# Patient Record
Sex: Male | Born: 1985 | Race: Black or African American | Hispanic: No | Smoking: Current every day smoker
Health system: Southern US, Community
[De-identification: ages and names within clinical notes are randomized; demographics above are authoritative.]

## PROBLEM LIST (undated history)

## (undated) DIAGNOSIS — R569 Unspecified convulsions: Secondary | ICD-10-CM

---

## 2007-07-26 ENCOUNTER — Emergency Department (HOSPITAL_COMMUNITY): Admission: EM | Admit: 2007-07-26 | Discharge: 2007-07-26 | Payer: Self-pay | Admitting: Emergency Medicine

## 2011-03-27 ENCOUNTER — Emergency Department (HOSPITAL_COMMUNITY): Payer: Self-pay

## 2011-03-27 ENCOUNTER — Emergency Department (HOSPITAL_COMMUNITY)
Admission: EM | Admit: 2011-03-27 | Discharge: 2011-03-27 | Disposition: A | Discharge status: Home / Self Care | Payer: Self-pay | Attending: Emergency Medicine | Admitting: Emergency Medicine

## 2011-03-27 DIAGNOSIS — I10 Essential (primary) hypertension: Secondary | ICD-10-CM | POA: Insufficient documentation

## 2011-03-27 DIAGNOSIS — X500XXA Overexertion from strenuous movement or load, initial encounter: Secondary | ICD-10-CM | POA: Insufficient documentation

## 2011-03-27 DIAGNOSIS — G40909 Epilepsy, unspecified, not intractable, without status epilepticus: Secondary | ICD-10-CM | POA: Insufficient documentation

## 2011-03-27 DIAGNOSIS — S93409A Sprain of unspecified ligament of unspecified ankle, initial encounter: Secondary | ICD-10-CM | POA: Insufficient documentation

## 2011-04-15 ENCOUNTER — Emergency Department (HOSPITAL_COMMUNITY)
Admission: EM | Admit: 2011-04-15 | Discharge: 2011-04-15 | Disposition: A | Discharge status: Home / Self Care | Payer: Self-pay | Attending: Emergency Medicine | Admitting: Emergency Medicine

## 2011-04-15 ENCOUNTER — Emergency Department (HOSPITAL_COMMUNITY): Payer: Self-pay

## 2011-04-15 DIAGNOSIS — S93409A Sprain of unspecified ligament of unspecified ankle, initial encounter: Secondary | ICD-10-CM | POA: Insufficient documentation

## 2011-04-15 DIAGNOSIS — I10 Essential (primary) hypertension: Secondary | ICD-10-CM | POA: Insufficient documentation

## 2011-04-15 DIAGNOSIS — M25579 Pain in unspecified ankle and joints of unspecified foot: Secondary | ICD-10-CM | POA: Insufficient documentation

## 2011-04-15 DIAGNOSIS — R569 Unspecified convulsions: Secondary | ICD-10-CM | POA: Insufficient documentation

## 2011-04-15 DIAGNOSIS — X58XXXA Exposure to other specified factors, initial encounter: Secondary | ICD-10-CM | POA: Insufficient documentation

## 2012-07-01 IMAGING — CR DG ANKLE COMPLETE 3+V*R*
4 series · 4 of 4 positions shown · non-contrast
Comparison: None.

CLINICAL DATA: Right lateral ankle pain/swelling

RIGHT ANKLE - COMPLETE 3+ VIEW

[t ankle joint ap right]
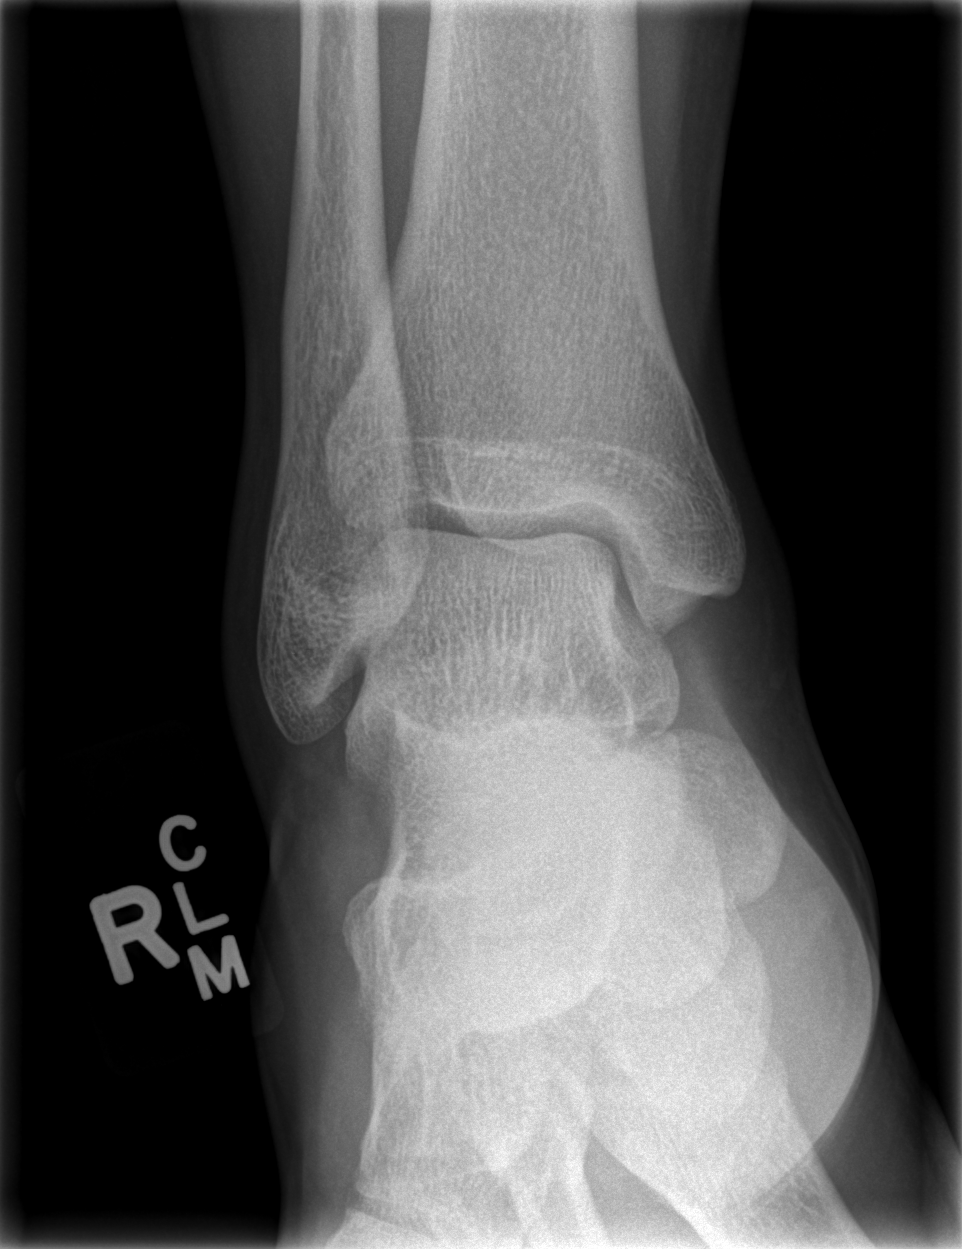

[t ankle joint oblique right]
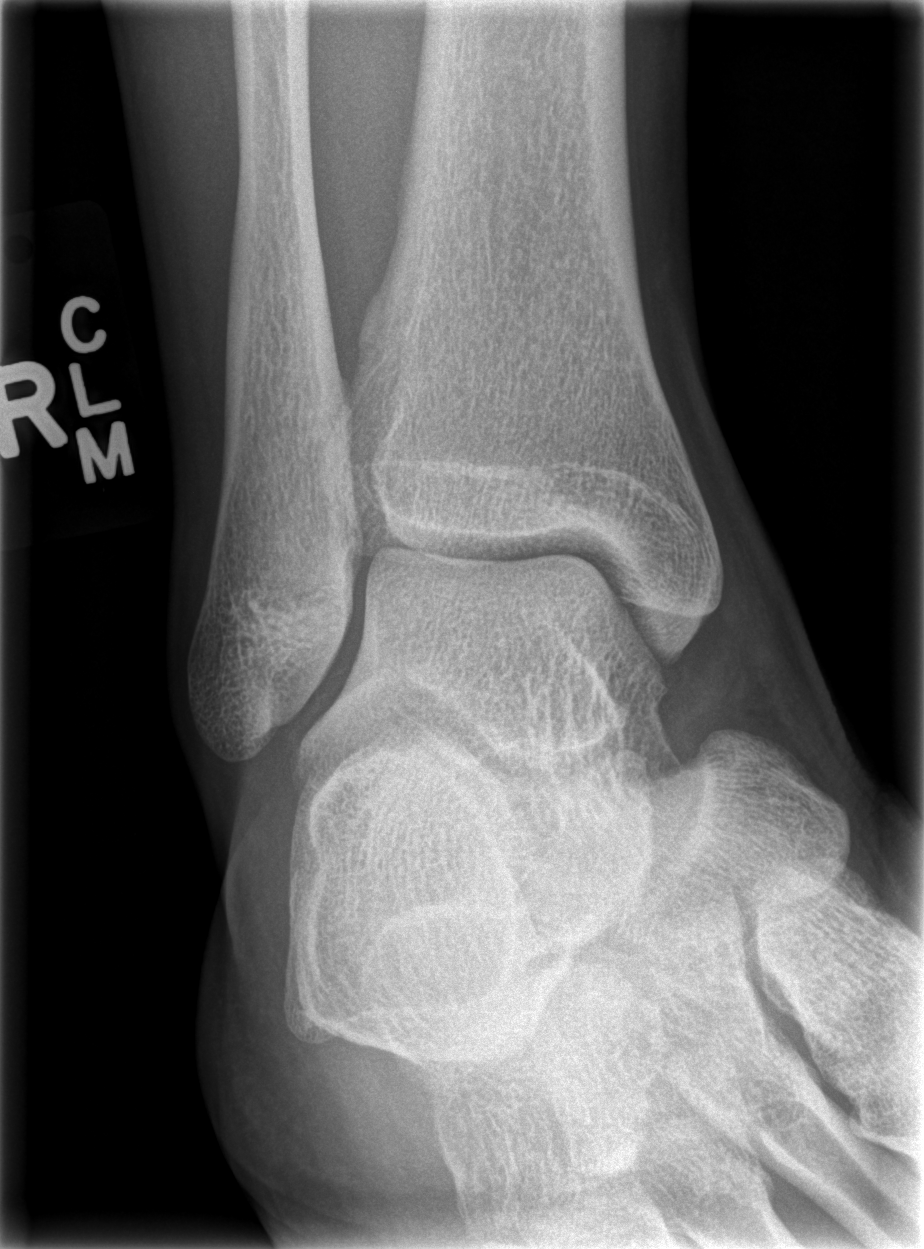

[t ankle joint lat right (1 of 2)]
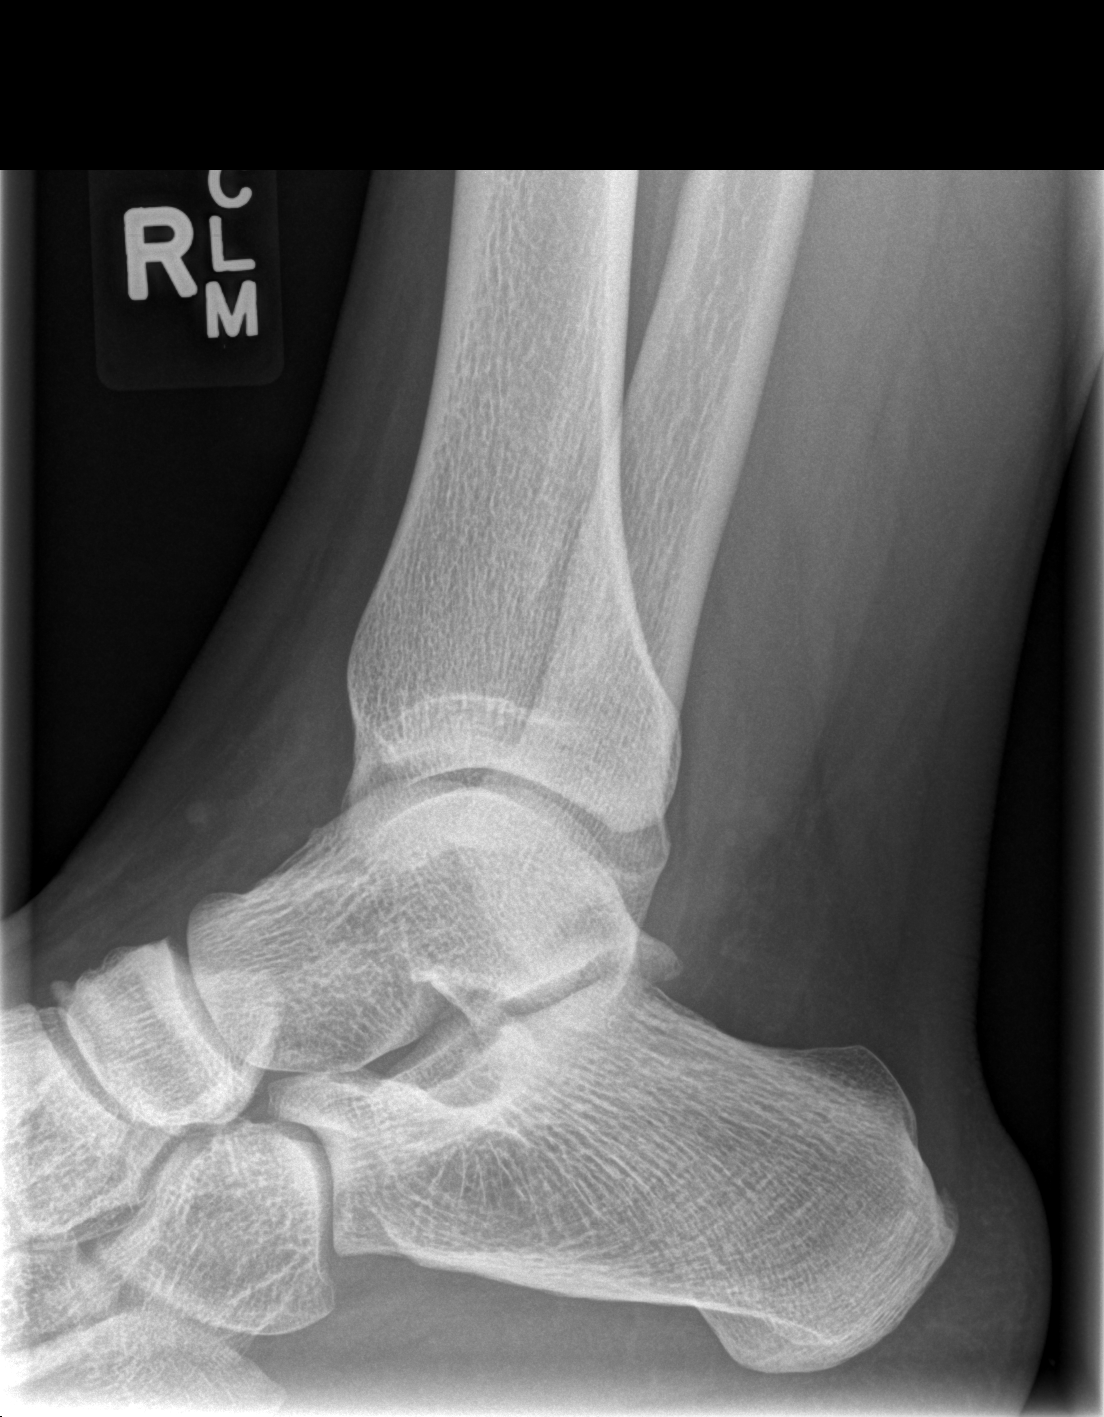

[t ankle joint lat right (2 of 2)]
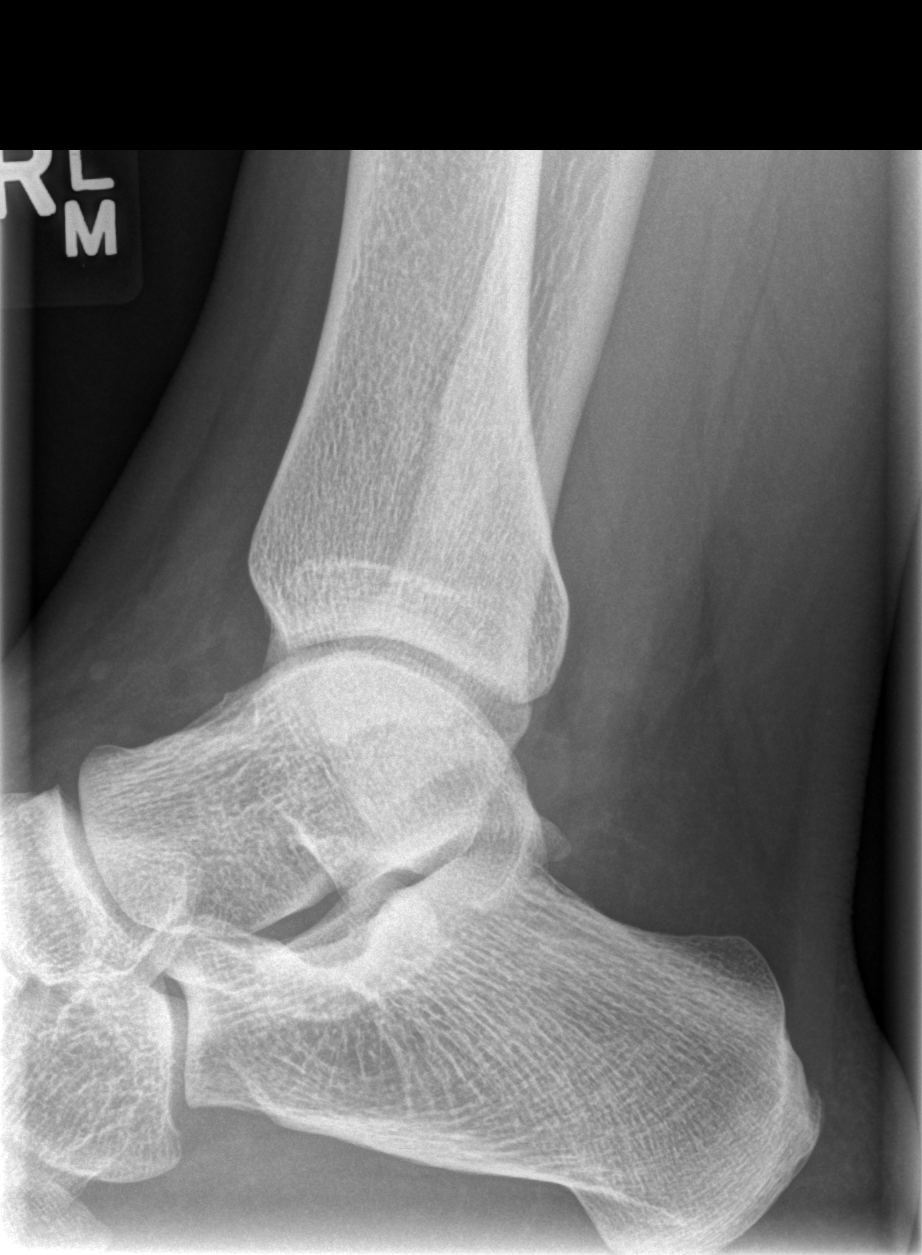

[4 of 4 positions shown; findings below may reference images not displayed]

FINDINGS: No fracture or dislocation is seen.  The ankle mortise is
intact.

The joint spaces are preserved.

Mild generalized soft tissue swelling about the ankle.
IMPRESSION: No fracture or dislocation is seen.

## 2016-11-09 ENCOUNTER — Encounter (HOSPITAL_COMMUNITY): Payer: Self-pay

## 2016-11-09 ENCOUNTER — Encounter (HOSPITAL_COMMUNITY): Payer: Self-pay | Admitting: Emergency Medicine

## 2016-11-09 ENCOUNTER — Emergency Department (HOSPITAL_COMMUNITY)
Admission: EM | Admit: 2016-11-09 | Discharge: 2016-11-09 | Disposition: A | Discharge status: Other Acute Inpatient Hospital | Payer: Managed Care, Other (non HMO) | Attending: Emergency Medicine | Admitting: Emergency Medicine

## 2016-11-09 ENCOUNTER — Inpatient Hospital Stay (HOSPITAL_COMMUNITY)
Admission: AD | Admit: 2016-11-09 | Discharge: 2016-11-11 | DRG: 885 | Disposition: A | Discharge status: Home / Self Care | Payer: 59 | Attending: Psychiatry | Admitting: Psychiatry

## 2016-11-09 DIAGNOSIS — Z5181 Encounter for therapeutic drug level monitoring: Secondary | ICD-10-CM | POA: Diagnosis not present

## 2016-11-09 DIAGNOSIS — F4321 Adjustment disorder with depressed mood: Secondary | ICD-10-CM | POA: Diagnosis present

## 2016-11-09 DIAGNOSIS — Z79899 Other long term (current) drug therapy: Secondary | ICD-10-CM | POA: Diagnosis not present

## 2016-11-09 DIAGNOSIS — R45851 Suicidal ideations: Secondary | ICD-10-CM | POA: Diagnosis present

## 2016-11-09 DIAGNOSIS — G4700 Insomnia, unspecified: Secondary | ICD-10-CM | POA: Diagnosis present

## 2016-11-09 DIAGNOSIS — F322 Major depressive disorder, single episode, severe without psychotic features: Secondary | ICD-10-CM | POA: Diagnosis present

## 2016-11-09 DIAGNOSIS — F17210 Nicotine dependence, cigarettes, uncomplicated: Secondary | ICD-10-CM

## 2016-11-09 DIAGNOSIS — F329 Major depressive disorder, single episode, unspecified: Secondary | ICD-10-CM | POA: Diagnosis present

## 2016-11-09 DIAGNOSIS — F4322 Adjustment disorder with anxiety: Secondary | ICD-10-CM | POA: Diagnosis present

## 2016-11-09 DIAGNOSIS — F17200 Nicotine dependence, unspecified, uncomplicated: Secondary | ICD-10-CM | POA: Insufficient documentation

## 2016-11-09 DIAGNOSIS — Z046 Encounter for general psychiatric examination, requested by authority: Secondary | ICD-10-CM | POA: Diagnosis present

## 2016-11-09 DIAGNOSIS — F1994 Other psychoactive substance use, unspecified with psychoactive substance-induced mood disorder: Secondary | ICD-10-CM | POA: Diagnosis not present

## 2016-11-09 HISTORY — DX: Unspecified convulsions: R56.9

## 2016-11-09 LAB — ETHANOL: Alcohol, Ethyl (B): <5 mg/dL (ref <5)

## 2016-11-09 LAB — COMPREHENSIVE METABOLIC PANEL
ALBUMIN: 5 g/dL (ref 3.5–5.0)
ALK PHOS: 57 U/L (ref 38–126)
ALT: 17 U/L (ref 17–63)
AST: 21 U/L (ref 15–41)
Anion gap: 9 (ref 5–15)
BUN: 11 mg/dL (ref 6–20)
CO2: 27 mmol/L (ref 22–32)
CREATININE: 1.32 mg/dL — AB (ref 0.61–1.24)
Calcium: 9.3 mg/dL (ref 8.9–10.3)
Chloride: 104 mmol/L (ref 101–111)
GFR calc Af Amer: >60 mL/min (ref >60)
GFR calc non Af Amer: >60 mL/min (ref >60)
GLUCOSE: 115 mg/dL — AB (ref 65–99)
Potassium: 3.9 mmol/L (ref 3.5–5.1)
SODIUM: 140 mmol/L (ref 135–145)
TOTAL PROTEIN: 8.8 g/dL — AB (ref 6.5–8.1)
Total Bilirubin: 0.9 mg/dL (ref 0.3–1.2)

## 2016-11-09 LAB — CBC WITH DIFFERENTIAL/PLATELET
BASOS PCT: 0 %
Basophils Absolute: 0 10*3/uL (ref 0.0–0.1)
Eosinophils Absolute: 0 10*3/uL (ref 0.0–0.7)
Eosinophils Relative: 0 %
HEMATOCRIT: 37.4 % — AB (ref 39.0–52.0)
Hemoglobin: 13.2 g/dL (ref 13.0–17.0)
LYMPHS PCT: 20 %
Lymphs Abs: 1.7 10*3/uL (ref 0.7–4.0)
MCH: 31 pg (ref 26.0–34.0)
MCHC: 35.3 g/dL (ref 30.0–36.0)
MCV: 87.8 fL (ref 78.0–100.0)
Monocytes Absolute: 0.5 10*3/uL (ref 0.1–1.0)
Monocytes Relative: 6 %
NEUTROS ABS: 6.1 10*3/uL (ref 1.7–7.7)
Neutrophils Relative %: 74 %
PLATELETS: 439 10*3/uL — AB (ref 150–400)
RBC: 4.26 MIL/uL (ref 4.22–5.81)
RDW: 11.7 % (ref 11.5–15.5)
WBC: 8.3 10*3/uL (ref 4.0–10.5)

## 2016-11-09 LAB — RAPID URINE DRUG SCREEN, HOSP PERFORMED
AMPHETAMINES: NOT DETECTED
BENZODIAZEPINES: NOT DETECTED
Barbiturates: NOT DETECTED
COCAINE: NOT DETECTED
Opiates: NOT DETECTED
Tetrahydrocannabinol: POSITIVE — AB

## 2016-11-09 MED ORDER — ONDANSETRON HCL 4 MG PO TABS: 4.0000 mg | ORAL_TABLET | Freq: Three times a day (TID) | ORAL | Status: DC | PRN

## 2016-11-09 MED ORDER — ALUM & MAG HYDROXIDE-SIMETH 200-200-20 MG/5ML PO SUSP: 30.0000 mL | ORAL | Status: DC | PRN

## 2016-11-09 MED ORDER — IBUPROFEN 600 MG PO TABS: 600.0000 mg | ORAL_TABLET | Freq: Three times a day (TID) | ORAL | Status: DC | PRN

## 2016-11-09 MED ORDER — FLUOXETINE HCL 10 MG PO CAPS
10.0000 mg | ORAL_CAPSULE | Freq: Every day | ORAL | Status: DC
  Filled 2016-11-09: qty 1

## 2016-11-09 MED ORDER — ACETAMINOPHEN 325 MG PO TABS: 650.0000 mg | ORAL_TABLET | ORAL | Status: DC | PRN

## 2016-11-09 MED ORDER — LORAZEPAM 1 MG PO TABS: 0.0000 mg | ORAL_TABLET | Freq: Two times a day (BID) | ORAL | Status: DC

## 2016-11-09 MED ORDER — THIAMINE HCL 100 MG/ML IJ SOLN: 100.0000 mg | Freq: Every day | INTRAMUSCULAR | Status: DC

## 2016-11-09 MED ORDER — TRAZODONE HCL 50 MG PO TABS
50.0000 mg | ORAL_TABLET | Freq: Every day | ORAL | Status: DC
  Filled 2016-11-09 (×4): qty 1

## 2016-11-09 MED ORDER — NICOTINE 21 MG/24HR TD PT24
21.0000 mg | MEDICATED_PATCH | Freq: Every day | TRANSDERMAL | Status: DC
  Filled 2016-11-09 (×5): qty 1

## 2016-11-09 MED ORDER — ACETAMINOPHEN 325 MG PO TABS
650.0000 mg | ORAL_TABLET | ORAL | Status: DC | PRN
Start: 2016-11-09 — End: 2016-11-11

## 2016-11-09 MED ORDER — LORAZEPAM 1 MG PO TABS: 0.0000 mg | ORAL_TABLET | Freq: Four times a day (QID) | ORAL | Status: DC

## 2016-11-09 MED ORDER — VITAMIN B-1 100 MG PO TABS
100.0000 mg | ORAL_TABLET | Freq: Every day | ORAL | Status: DC
  Administered 2016-11-09: 100 mg via ORAL
  Filled 2016-11-09: qty 1

## 2016-11-09 MED ORDER — ZOLPIDEM TARTRATE 5 MG PO TABS
5.0000 mg | ORAL_TABLET | Freq: Every evening | ORAL | Status: DC | PRN
Start: 2016-11-09 — End: 2016-11-09

## 2016-11-09 MED ORDER — TRAZODONE HCL 50 MG PO TABS: 50.0000 mg | ORAL_TABLET | Freq: Every day | ORAL | Status: DC

## 2016-11-09 MED ORDER — IBUPROFEN 200 MG PO TABS: 600.0000 mg | ORAL_TABLET | Freq: Three times a day (TID) | ORAL | Status: DC | PRN

## 2016-11-09 MED ORDER — FLUOXETINE HCL 10 MG PO CAPS
10.0000 mg | ORAL_CAPSULE | Freq: Every day | ORAL | Status: DC
  Filled 2016-11-09 (×5): qty 1

## 2016-11-09 MED ORDER — MAGNESIUM HYDROXIDE 400 MG/5ML PO SUSP: 30.0000 mL | Freq: Every day | ORAL | Status: DC | PRN

## 2016-11-09 MED ORDER — NICOTINE 21 MG/24HR TD PT24: 21.0000 mg | MEDICATED_PATCH | Freq: Every day | TRANSDERMAL | Status: DC

## 2016-11-09 NOTE — ED Notes (Signed)
Patient offered breakfast but is not eating.

## 2016-11-09 NOTE — ED Notes (Signed)
Bed: WA32 Expected date:  Expected time:  Means of arrival:  Comments: 
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
## 2016-11-09 NOTE — ED Notes (Signed)
Pt was with his girlfriend and left her a voicemail that he was going to "blow his brains out." there is a gun in the house. When [employer] arrived, they found him trying to back out of the driveway in a car with a loaded gun. He denies SI to police.

## 2016-11-09 NOTE — ED Notes (Signed)
[facility], Evangeline GulaKaren RN.  Patient to be transported one hour  after another patient who is [facility]'d [facility]  [employer] called for transport.

## 2016-11-09 NOTE — BH Assessment (Addendum)
Assessment Note  Kyle Sullivan is an 31 y.o. male prsenting to [facility]ED under IVC by the EDP.    IVC states: left a message on his girlfriend's voicemail that he was going to "blow his brains out." Police found him in his car with a loaded gun.  Patient states that he "felt like the weight of the world was on my shoulders" due to losing his job and states that he usually does not drink but drank about one fourth of a fifth of Vodka. Patient states that after drinking he called his girlfriend and left her a message. Patient states "that's njust how we do, like when we're stressed. She thought I was going to leave her and she told me she was going to kill herself and I went to check on her then." Patient states that after leaving the message he got a call from [employer] and was offered a job to start tomorrow. Patient states "I should have called her back but I didn't." Patient states that he felt better after getting the job offer and went to buy some cigarettes when he realized that his girlfriends gun was in the car. Patient states "I don't have a concealed to carry so I put it on the seat to go to the store." Patient states "she usually has it in her car or in the safe - that I don't even have access to, but she left in my car." patient states "this is the worst time for her to have left it in my car."   Patient denies suicidal ideations and history of attempts. Patient denies self injurious behaviors. Patient denies history of depression. Patient denies feeling depressed at this time. Patient denies symptoms of depression when asked. Patient denies homicidal ideations and history of aggression. Patient states that his girlfriend does have a gun but states she usually keeps it in a safe that he does not have access to. Patient denies pending charges or upcoming court dates. Patient denies active probation. Patient denies auditory and visual hallucinations. Patient does not appear to be responding  to internal stimuli. Patient denies use of drugs and states that he rarely drinks alcohol. Patient UDS +THC and BAL <5. Patient denies previous inpatient and outpatient psychiatric history. Patient denies history of trauma/abuse. Patient states that his mother, father, and brothers are supportive.   Consulted with Kyle SievertSpencer Simon, PA-C who recommends AM Psych eval.   Diagnosis: Adjustment Disorder  Past Medical History:  Past Medical History:  Diagnosis Date  . Seizure [facility](HCC)     History reviewed. No pertinent surgical history.  Family History: History reviewed. No pertinent family history.  Social History:  reports that he has been smoking.  He has never used smokeless tobacco. He reports that he drinks alcohol. He reports that he does not use drugs.  Additional Social History:  Alcohol / Drug Use Pain Medications: Denies Prescriptions: Denies Over the Counter: Denies History of alcohol / drug use?: No history of alcohol / drug abuse  CIWA: CIWA-Ar BP: 151/100 Pulse Rate: 87 COWS:    Allergies: No Known Allergies  Home Medications:  (Not in a hospital admission)  OB/GYN Status:  No LMP for male patient.  General Assessment Data Location of Assessment: [facility] TTS Assessment: In system Is this a Tele or Face-to-Face Assessment?: Face-to-Face Is this an Initial Assessment or a Re-assessment for this encounter?: Initial Assessment Marital status: Divorced (2 years ) Is patient pregnant?: No Pregnancy Status: No Living Arrangements: Spouse/significant other (  girlfriend) Can pt return to current living arrangement?: Yes Admission Status: Involuntary Is patient capable of signing voluntary admission?: No Referral Source: Self/Family/Friend     Crisis Care Plan Living Arrangements: Spouse/significant other (girlfriend) Name of Psychiatrist: None Name of Therapist: None  Education Status Is patient currently in school?: No Highest grade of school patient has completed:  culinary degree  Risk to self with the past 6 months Suicidal Ideation: No Has patient been a risk to self within the past 6 months prior to admission? : No Suicidal Intent: No Has patient had any suicidal intent within the past 6 months prior to admission? : No Is patient at risk for suicide?: No Suicidal Plan?: No Has patient had any suicidal plan within the past 6 months prior to admission? : No Access to Means: No What has been your use of drugs/alcohol within the last 12 months?: Denies Previous Attempts/Gestures: No How many times?: 0 Other Self Harm Risks: Denies Triggers for Past Attempts: None known Intentional Self Injurious Behavior: None Family Suicide History: No Recent stressful life event(s): Job Loss Persecutory voices/beliefs?: No Depression: No (patient denies) Depression Symptoms:  (denies symptoms) Substance abuse history and/or treatment for substance abuse?: No Suicide prevention information given to non-admitted patients: Not applicable  Risk to Others within the past 6 months Homicidal Ideation: No Does patient have any lifetime risk of violence toward others beyond the six months prior to admission? : No Thoughts of Harm to Others: No Current Homicidal Intent: No Current Homicidal Plan: No Access to Homicidal Means: No Identified Victim: Denies History of harm to others?: No Assessment of Violence: None Noted Violent Behavior Description: Denies Does patient have access to weapons?: Yes (Comment) (patients girlfriend has a gun ) Criminal Charges Pending?: No Does patient have a court date: No Is patient on probation?: No  Psychosis Hallucinations: None noted Delusions: None noted  Mental Status Report Appearance/Hygiene: In scrubs Eye Contact: Good Motor Activity: Unable to assess Speech: Logical/coherent Level of Consciousness: Alert Mood: Pleasant Affect: Appropriate to circumstance Anxiety Level: None Thought Processes: Coherent,  Relevant Judgement: Unimpaired Orientation: Person, Place, Time, Situation, Appropriate for developmental age Obsessive Compulsive Thoughts/Behaviors: None  Cognitive Functioning Concentration: Normal Memory: Recent Intact, Remote Intact IQ: Average Insight: Fair Impulse Control: Fair Appetite: Good Sleep: No Change Total Hours of Sleep:  (6-8) Vegetative Symptoms: None  ADLScreening [facility] Assessment Services) Patient's cognitive ability adequate to safely complete daily activities?: Yes Patient able to express need for assistance with ADLs?: Yes Independently performs ADLs?: Yes (appropriate for developmental age)  Prior Inpatient Therapy Prior Inpatient Therapy: No Prior Therapy Dates: N/A Prior Therapy Facilty/Provider(s): N/A Reason for Treatment: N/A  Prior Outpatient Therapy Prior Outpatient Therapy: No Prior Therapy Dates: N/A Prior Therapy Facilty/Provider(s): N/A Reason for Treatment: N/A Does patient have an ACCT team?: No Does patient have Intensive In-House Services?  : No Does patient have [facility] services? : No Does patient have P4CC services?: No  ADL Screening (condition at time of admission) Patient's cognitive ability adequate to safely complete daily activities?: Yes Is the patient deaf or have difficulty hearing?: No Does the patient have difficulty seeing, even when wearing glasses/contacts?: No Does the patient have difficulty concentrating, remembering, or making decisions?: No Patient able to express need for assistance with ADLs?: Yes Does the patient have difficulty dressing or bathing?: No Independently performs ADLs?: Yes (appropriate for developmental age) Does the patient have difficulty walking or climbing stairs?: No Weakness of Legs: None Weakness of Arms/Hands: None  Home Assistive Devices/Equipment Home Assistive Devices/Equipment: None    Abuse/Neglect Assessment (Assessment to be complete while patient is alone) Physical Abuse:  Denies Verbal Abuse: Denies Sexual Abuse: Denies Exploitation of patient/patient's resources: Denies Self-Neglect: Denies Values / Beliefs Cultural Requests During Hospitalization: None Spiritual Requests During Hospitalization: None Consults Spiritual Care Consult Needed: No Social Work Consult Needed: No Merchant navy officer (For Healthcare) Does Patient Have a Medical Advance Directive?: No Would patient like information on creating a medical advance directive?: No - Patient declined    Additional Information 1:1 In Past 12 Months?: No CIRT Risk: No Elopement Risk: No Does patient have medical clearance?: Yes     Disposition:  Disposition Initial Assessment Completed for this Encounter: Yes Disposition of Patient: Other dispositions (Am psych eval per Kyle Sievert, PA-C) Other disposition(s): Other (Comment)  On Site Evaluation by:   Reviewed with Physician:    Nicky Milhouse 11/09/2016 4:26 AM
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
## 2016-11-09 NOTE — Progress Notes (Signed)
Pt rated his day a 10 out of 10. Pt goal for tomorrow is to talk with the doctor about discharge plans.

## 2016-11-09 NOTE — BH Assessment (Signed)
Assessment completed.  Consulted with Donell SievertSpencer Simon, PA-C who recommends AM psych eval.   Davina PokeJoVea Jourdan Durbin, LCSW Therapeutic Triage Specialist [facility] 11/09/2016 3:03 AM

## 2016-11-09 NOTE — Tx Team (Addendum)
Initial Treatment Plan 11/09/2016 5:38 PM Kyle ArtistDarwin D Bi ZOX:[id number]    PATIENT STRESSORS: Financial difficulties Other: Relationship conflict   PATIENT STRENGTHS: Average or above average intelligence Capable of independent living Communication skills Supportive family/friends   PATIENT IDENTIFIED PROBLEMS: "Alvy BealSteer clear of that woman"   "Continue on with my life in a peaceful manner"                   DISCHARGE CRITERIA:  Adequate post-discharge living arrangements Improved stabilization in mood, thinking, and/or behavior  PRELIMINARY DISCHARGE PLAN: Outpatient therapy  PATIENT/FAMILY INVOLVEMENT: This treatment plan has been presented to and reviewed with the patient, Kyle Sullivan.  The patient and family have been given the opportunity to ask questions and make suggestions.  Maurine SimmeringShugart, Fate Galanti M, RN 11/09/2016, 5:38 PM

## 2016-11-09 NOTE — BH Assessment (Signed)
[facility] Assessment Progress Note  Per Thedore MinsMojeed Akintayo, MD, this pt requires psychiatric hospitalization.  Malva LimesLinsey Strader, RN, [facility].  Pt presents under IVC initiated by EDP Ronna Poliohristopher Polina, MD, however, paperwork is incomplete and therefore invalid.  A new IVC has been initiated by Dr Jannifer FranklinAkintayo, and [place] [facility].  At 11:32 Burna CashMagistrate Morton confirms receipt.  As of this writing, service of Findings and Custody Order is pending.  Petition and First Examination [facility].  Pt's nurse, Aram BeechamCynthia, has been notified, and agrees to call report to [phone].  Pt is to be transported via Patent examinerlaw enforcement.   Doylene Canninghomas Blaze Sandin, MA Triage Specialist [phone]

## 2016-11-09 NOTE — Consult Note (Signed)
[facility] Face-to-Face Psychiatry Consult   Reason for Consult:  Depression with suicide attempt Referring Physician:  EDP Patient Identification: Kyle Sullivan MRN:  [id number] Principal Diagnosis: Major depressive disorder, single episode, severe without psychosis ([place]) Diagnosis:   Patient Active Problem List   Diagnosis Date Noted  . Major depressive disorder, single episode, severe without psychosis ([place]) [F32.2] 11/09/2016    Priority: High    Total Time spent with patient: 45 minutes  Subjective:   Kyle Sullivan is a 31 y.o. male patient admitted with suicide attempt.  HPI:  31 yo male who presented to the ED with depression and suicide attempt by a gun.  He sent his girlfriend a  saying he was going to blow his brain's out.  She called the police and they found him in the car with a loaded gun.  Today, he denies having the gun, stating it is his girlfriends.  He reports he does not know what he said to his girlfriend.  Recently lost his job but he states he just got another one last night.  No prior history, no hallucinations or homicidal ideations.  Reports he socially drinks.  Past Psychiatric History: none   Risk to Self: Yes Risk to Others: Homicidal Ideation: No Thoughts of Harm to Others: No Current Homicidal Intent: No Current Homicidal Plan: No Access to Homicidal Means: No Identified Victim: Denies History of harm to others?: No Assessment of Violence: None Noted Violent Behavior Description: Denies Does patient have access to weapons?: Yes (Comment) (patients girlfriend has a gun ) Criminal Charges Pending?: No Does patient have a court date: No Prior Inpatient Therapy: Prior Inpatient Therapy: No Prior Therapy Dates: N/A Prior Therapy Facilty/Provider(s): N/A Reason for Treatment: N/A Prior Outpatient Therapy: Prior Outpatient Therapy: No Prior Therapy Dates: N/A Prior Therapy Facilty/Provider(s): N/A Reason for Treatment: N/A Does patient have an  ACCT team?: No Does patient have Intensive In-House Services?  : No Does patient have [facility] services? : No Does patient have P4CC services?: No  Past Medical History:  Past Medical History:  Diagnosis Date  . Seizure [facility])    History reviewed. No pertinent surgical history. Family History: History reviewed. No pertinent family history. Family Psychiatric  History: none Social History:  History  Alcohol Use  . Yes     History  Drug Use No    Social History   Social History  . Marital status: Divorced    Spouse name: N/A  . Number of children: N/A  . Years of education: N/A   Social History Main Topics  . Smoking status: Current Every Day Smoker  . Smokeless tobacco: Never Used  . Alcohol use Yes  . Drug use: No  . Sexual activity: Yes   Other Topics Concern  . None   Social History Narrative  . None   Additional Social History:    Allergies:  No Known Allergies  Labs:  Results for orders placed or performed during the hospital encounter of 11/09/16 (from the past 48 hour(s))  Comprehensive metabolic panel     Status: Abnormal   Collection Time: 11/09/16  2:39 AM  Result Value Ref Range   Sodium 140 135 - 145 mmol/L   Potassium 3.9 3.5 - 5.1 mmol/L   Chloride 104 101 - 111 mmol/L   CO2 27 22 - 32 mmol/L   Glucose, Bld 115 (H) 65 - 99 mg/dL   BUN 11 6 - 20 mg/dL   Creatinine, Ser 1.32 (H) 0.61 - 1.24  mg/dL   Calcium 9.3 8.9 - 10.3 mg/dL   Total Protein 8.8 (H) 6.5 - 8.1 g/dL   Albumin 5.0 3.5 - 5.0 g/dL   AST 21 15 - 41 U/L   ALT 17 17 - 63 U/L   Alkaline Phosphatase 57 38 - 126 U/L   Total Bilirubin 0.9 0.3 - 1.2 mg/dL   GFR calc non Af Amer >60 >60 mL/min   GFR calc Af Amer >60 >60 mL/min    Comment: (NOTE) The eGFR has been calculated using the CKD EPI equation. This calculation has not been validated in all clinical situations. eGFR's persistently <60 mL/min signify possible Chronic Kidney Disease.    Anion gap 9 5 - 15  Ethanol     Status:  None   Collection Time: 11/09/16  2:39 AM  Result Value Ref Range   Alcohol, Ethyl (B) <5 <5 mg/dL    Comment:        LOWEST DETECTABLE LIMIT FOR SERUM ALCOHOL IS 5 mg/dL FOR MEDICAL PURPOSES ONLY   CBC with Diff     Status: Abnormal   Collection Time: 11/09/16  2:39 AM  Result Value Ref Range   WBC 8.3 4.0 - 10.5 K/uL   RBC 4.26 4.22 - 5.81 MIL/uL   Hemoglobin 13.2 13.0 - 17.0 g/dL   HCT 37.4 (L) 39.0 - 52.0 %   MCV 87.8 78.0 - 100.0 fL   MCH 31.0 26.0 - 34.0 pg   MCHC 35.3 30.0 - 36.0 g/dL   RDW 11.7 11.5 - 15.5 %   Platelets 439 (H) 150 - 400 K/uL   Neutrophils Relative % 74 %   Neutro Abs 6.1 1.7 - 7.7 K/uL   Lymphocytes Relative 20 %   Lymphs Abs 1.7 0.7 - 4.0 K/uL   Monocytes Relative 6 %   Monocytes Absolute 0.5 0.1 - 1.0 K/uL   Eosinophils Relative 0 %   Eosinophils Absolute 0.0 0.0 - 0.7 K/uL   Basophils Relative 0 %   Basophils Absolute 0.0 0.0 - 0.1 K/uL  Urine rapid drug screen (hosp performed)not at [facility]     Status: Abnormal   Collection Time: 11/09/16  2:40 AM  Result Value Ref Range   Opiates NONE DETECTED NONE DETECTED   Cocaine NONE DETECTED NONE DETECTED   Benzodiazepines NONE DETECTED NONE DETECTED   Amphetamines NONE DETECTED NONE DETECTED   Tetrahydrocannabinol POSITIVE (A) NONE DETECTED   Barbiturates NONE DETECTED NONE DETECTED    Comment:        DRUG SCREEN FOR MEDICAL PURPOSES ONLY.  IF CONFIRMATION IS NEEDED FOR ANY PURPOSE, NOTIFY LAB WITHIN 5 DAYS.        LOWEST DETECTABLE LIMITS FOR URINE DRUG SCREEN Drug Class       Cutoff (ng/mL) Amphetamine      1000 Barbiturate      200 Benzodiazepine   371 Tricyclics       062 Opiates          300 Cocaine          300 THC              50     Current Facility-Administered Medications  Medication Dose Route Frequency Provider Last Rate Last Dose  . acetaminophen (TYLENOL) tablet 650 mg  650 mg Oral Q4H PRN Orpah Greek, MD      . alum & mag hydroxide-simeth (MAALOX/MYLANTA)  200-200-20 MG/5ML suspension 30 mL  30 mL Oral PRN Orpah Greek, MD      .  FLUoxetine (PROZAC) capsule 10 mg  10 mg Oral Daily Seven Marengo, MD      . ibuprofen (ADVIL,MOTRIN) tablet 600 mg  600 mg Oral Q8H PRN Orpah Greek, MD      . nicotine (NICODERM CQ - dosed in mg/24 hours) patch 21 mg  21 mg Transdermal Daily Orpah Greek, MD      . ondansetron (ZOFRAN) tablet 4 mg  4 mg Oral Q8H PRN Orpah Greek, MD      . traZODone (DESYREL) tablet 50 mg  50 mg Oral QHS Corena Pilgrim, MD       Current Outpatient Prescriptions  Medication Sig Dispense Refill  . ibuprofen (ADVIL,MOTRIN) 200 MG tablet Take 400 mg by mouth every 6 (six) hours as needed for headache, mild pain or moderate pain.      Musculoskeletal: Strength & Muscle Tone: within normal limits Gait & Station: normal Patient leans: N/A  Psychiatric Specialty Exam: Physical Exam  Constitutional: He is oriented to person, place, and time. He appears well-developed and well-nourished.  HENT:  Head: Normocephalic.  Neck: Normal range of motion.  Respiratory: Effort normal.  Musculoskeletal: Normal range of motion.  Neurological: He is alert and oriented to person, place, and time.  Psychiatric: His speech is normal and behavior is normal. Judgment normal. Cognition and memory are normal. He exhibits a depressed mood. He expresses suicidal ideation. He expresses suicidal plans.    Review of Systems  Psychiatric/Behavioral: Positive for depression and suicidal ideas.    Blood pressure 132/81, pulse 74, temperature 98.8 F (37.1 C), temperature source Oral, resp. rate 18, height '5\' 7"'$  (1.702 m), weight 93 kg (205 lb), SpO2 100 %.Body mass index is 32.11 kg/m.  General Appearance: Casual  Eye Contact:  Fair  Speech:  Normal Rate  Volume:  Decreased  Mood:  Depressed  Affect:  Congruent  Thought Process:  Coherent and Descriptions of Associations: Intact  Orientation:  Full (Time, Place, and  Person)  Thought Content:  Rumination  Suicidal Thoughts:  Yes.  with intent/plan  Homicidal Thoughts:  No  Memory:  Immediate;   Fair Recent;   Fair Remote;   Fair  Judgement:  Poor  Insight:  Lacking  Psychomotor Activity:  Decreased  Concentration:  Concentration: Fair and Attention Span: Fair  Recall:  [employer] of Knowledge:  Fair  Language:  Good  Akathisia:  No  Handed:  Right  AIMS (if indicated):     Assets:  Housing Intimacy Leisure Time Physical Health Resilience Social Support  ADL's:  Intact  Cognition:  WNL  Sleep:        Treatment Plan Summary: Daily contact with patient to assess and evaluate symptoms and progress in treatment, Medication management and Plan major depressive disorder, single episode, severe without psychosis:  -Crisis stabilization -Medication management:  Start Prozac 10 mg daily for depression and Trazodone 50 mg at bedtime for sleep -Individual counseling  Disposition: Recommend psychiatric Inpatient admission when medically cleared.  Waylan Boga, NP 11/09/2016 11:00 AM  Patient seen face-to-face for psychiatric evaluation, chart reviewed and case discussed with the physician extender and developed treatment plan. Reviewed the information documented and agree with the treatment plan. Corena Pilgrim, MD

## 2016-11-10 ENCOUNTER — Encounter (HOSPITAL_COMMUNITY): Payer: Self-pay | Admitting: Psychiatry

## 2016-11-10 DIAGNOSIS — Z79899 Other long term (current) drug therapy: Secondary | ICD-10-CM

## 2016-11-10 DIAGNOSIS — F1994 Other psychoactive substance use, unspecified with psychoactive substance-induced mood disorder: Secondary | ICD-10-CM

## 2016-11-10 NOTE — [facility] Suicide Risk Assessment (Signed)
[facility] INPATIENT:  Family/Significant Other Suicide Prevention Education  Suicide Prevention Education:  Contact Attempts: Lasandra Beechheresa Evans (pt's mother) [phone] has been identified by the patient as the family member/significant other with whom the patient will be residing, and identified as the person(s) who will aid the patient in the event of a mental health crisis.  With written consent from the patient, two attempts were made to provide suicide prevention education, prior to and/or following the patient's discharge.  We were unsuccessful in providing suicide prevention education.  A suicide education pamphlet was given to the patient to share with family/significant other.  Date and time of first attempt: 11/10/16 at 11:015AM.   Ledell PeoplesHeather N Smart LCSW 11/10/2016, 11:06 AM   CSW spoke with pt's mother. She has no concerns regarding pt's discharge scheduled for Thursday and SPE completed. Aftercare plan reviewed. Pt's mother plans to pick him up around 1:15-1:30PM tomorrow. No guns or access to weapons.   Trula SladeHeather Smart, MSW, LCSW Clinical Social Worker 11/10/2016 2:53 PM

## 2016-11-10 NOTE — Plan of Care (Signed)
Problem: Safety: Goal: Periods of time without injury will increase Outcome: Progressing Periods of time without injury will increase AEB q4415min safety checks, client's verbal agreement not to ham self.

## 2016-11-10 NOTE — Progress Notes (Signed)
D:  Patient awake and alert; oriented x 4; he denies suicidal and homicidal ideation and AVH; no self-injurious behaviors noted or reported. A: Patient refused a.m. Medications;  Emotional support provided; encouraged him to seek assistance with needs/concerns. R:  Safety maintained on unit.

## 2016-11-10 NOTE — H&P (Signed)
Psychiatric Admission Assessment Adult  Patient Identification: Kyle Sullivan MRN:  [id number] Date of Evaluation:  11/10/2016 Chief Complaint:  MDD ADJUSTMENT DISORDER Principal Diagnosis: suicidal ideation, substance abuse Diagnosis:   Patient Active Problem List   Diagnosis Date Noted  . Major depressive disorder, single episode, severe without psychosis ([place]) [F32.2] 11/09/2016   History of Present Illness: Patient is seen and examined. Patient is a 31 YO male with essentially no PPHx who presented under IVC after being reported to be intoxicated and threatening suicide. The patient stated that he has been living with an older male over the last 2 years. He reports that "she has psychiatric problems" and they recently had been having problems and he was going to move out. She had not left the condo yet because he had financial involment with the mortgage. He had been gaming with his friends and drinking alcohol. He became intoxicated and texted a message to his GF about wanting to kill himself. He stated that he was intoxicated at the time and does not remember things very well. GF reported his SI and police found a weapon in his car. He was taken under IVC. The patient stated he was not depressed, does not drink alcohol regularly and this was the reason it affected him greatly. He denied and previous hospitalizations or treatment for psychiatric reasons.  Associated Signs/Symptoms: Depression Symptoms:  anxiety, (Hypo) Manic Symptoms:  none Anxiety Symptoms:  Excessive Worry, Psychotic Symptoms:  none PTSD Symptoms: Negative Total Time spent with patient: 45 minutes  Past Psychiatric History: denied  Is the patient at risk to self? No.  Has the patient been a risk to self in the past 6 months? No.  Has the patient been a risk to self within the distant past? No.  Is the patient a risk to others? No.  Has the patient been a risk to others in the past 6 months? No.  Has the  patient been a risk to others within the distant past? No.   Prior Inpatient Therapy:   Prior Outpatient Therapy:    Alcohol Screening: Patient refused Alcohol Screening Tool: Yes 1. How often do you have a drink containing alcohol?: 2 to 3 times a week 2. How many drinks containing alcohol do you have on a typical day when you are drinking?: 1 or 2 3. How often do you have six or more drinks on one occasion?: Never Preliminary Score: 0 9. Have you or someone else been injured as a result of your drinking?: No 10. Has a relative or friend or a doctor or another health worker been concerned about your drinking or suggested you cut down?: No Alcohol Use Disorder Identification Test Final Score (AUDIT): 3 Brief Intervention: AUDIT score less than 7 or less-screening does not suggest unhealthy drinking-brief intervention not indicated Substance Abuse History in the last 12 months:  Yes.   Consequences of Substance Abuse: Legal Consequences:  current IVC Previous Psychotropic Medications: No  Psychological Evaluations: No  Past Medical History:  Past Medical History:  Diagnosis Date  . Seizure [facility])    reports last was when he was 76-4 years old   History reviewed. No pertinent surgical history. Family History: History reviewed. No pertinent family history. Family Psychiatric  History: denied Tobacco Screening: Have you used any form of tobacco in the last 30 days? (Cigarettes, Smokeless Tobacco, Cigars, and/or Pipes): Yes Tobacco use, Select all that apply: 4 or less cigarettes per day Are you interested in Tobacco Cessation Medications?:  No, patient refused Counseled patient on smoking cessation including recognizing danger situations, developing coping skills and basic information about quitting provided: Refused/Declined practical counseling Social History:  History  Alcohol Use  . Yes    Comment: Says he drinks 2-3x/week; 1-2 drinks per sitting     History  Drug Use No     Comment: pt says he "used to" smoke marijuana; UDS positive for it    Additional Social History:                           Allergies:  No Known Allergies Lab Results:  Results for orders placed or performed during the hospital encounter of 11/09/16 (from the past 48 hour(s))  Comprehensive metabolic panel     Status: Abnormal   Collection Time: 11/09/16  2:39 AM  Result Value Ref Range   Sodium 140 135 - 145 mmol/L   Potassium 3.9 3.5 - 5.1 mmol/L   Chloride 104 101 - 111 mmol/L   CO2 27 22 - 32 mmol/L   Glucose, Bld 115 (H) 65 - 99 mg/dL   BUN 11 6 - 20 mg/dL   Creatinine, Ser 9.23 (H) 0.61 - 1.24 mg/dL   Calcium 9.3 8.9 - 82.5 mg/dL   Total Protein 8.8 (H) 6.5 - 8.1 g/dL   Albumin 5.0 3.5 - 5.0 g/dL   AST 21 15 - 41 U/L   ALT 17 17 - 63 U/L   Alkaline Phosphatase 57 38 - 126 U/L   Total Bilirubin 0.9 0.3 - 1.2 mg/dL   GFR calc non Af Amer >60 >60 mL/min   GFR calc Af Amer >60 >60 mL/min    Comment: (NOTE) The eGFR has been calculated using the CKD EPI equation. This calculation has not been validated in all clinical situations. eGFR's persistently <60 mL/min signify possible Chronic Kidney Disease.    Anion gap 9 5 - 15  Ethanol     Status: None   Collection Time: 11/09/16  2:39 AM  Result Value Ref Range   Alcohol, Ethyl (B) <5 <5 mg/dL    Comment:        LOWEST DETECTABLE LIMIT FOR SERUM ALCOHOL IS 5 mg/dL FOR MEDICAL PURPOSES ONLY   CBC with Diff     Status: Abnormal   Collection Time: 11/09/16  2:39 AM  Result Value Ref Range   WBC 8.3 4.0 - 10.5 K/uL   RBC 4.26 4.22 - 5.81 MIL/uL   Hemoglobin 13.2 13.0 - 17.0 g/dL   HCT 09.2 (L) 88.0 - 13.2 %   MCV 87.8 78.0 - 100.0 fL   MCH 31.0 26.0 - 34.0 pg   MCHC 35.3 30.0 - 36.0 g/dL   RDW 72.0 50.8 - 46.1 %   Platelets 439 (H) 150 - 400 K/uL   Neutrophils Relative % 74 %   Neutro Abs 6.1 1.7 - 7.7 K/uL   Lymphocytes Relative 20 %   Lymphs Abs 1.7 0.7 - 4.0 K/uL   Monocytes Relative 6 %   Monocytes  Absolute 0.5 0.1 - 1.0 K/uL   Eosinophils Relative 0 %   Eosinophils Absolute 0.0 0.0 - 0.7 K/uL   Basophils Relative 0 %   Basophils Absolute 0.0 0.0 - 0.1 K/uL  Urine rapid drug screen (hosp performed)not at [facility]     Status: Abnormal   Collection Time: 11/09/16  2:40 AM  Result Value Ref Range   Opiates NONE DETECTED NONE DETECTED   Cocaine NONE  DETECTED NONE DETECTED   Benzodiazepines NONE DETECTED NONE DETECTED   Amphetamines NONE DETECTED NONE DETECTED   Tetrahydrocannabinol POSITIVE (A) NONE DETECTED   Barbiturates NONE DETECTED NONE DETECTED    Comment:        DRUG SCREEN FOR MEDICAL PURPOSES ONLY.  IF CONFIRMATION IS NEEDED FOR ANY PURPOSE, NOTIFY LAB WITHIN 5 DAYS.        LOWEST DETECTABLE LIMITS FOR URINE DRUG SCREEN Drug Class       Cutoff (ng/mL) Amphetamine      1000 Barbiturate      200 Benzodiazepine   505 Tricyclics       397 Opiates          300 Cocaine          300 THC              50     Blood Alcohol level:  Lab Results  Component Value Date   ETH <5 67/34/1936    Metabolic Disorder Labs:  No results found for: HGBA1C, MPG No results found for: PROLACTIN No results found for: CHOL, TRIG, HDL, CHOLHDL, VLDL, LDLCALC  Current Medications: Current Facility-Administered Medications  Medication Dose Route Frequency Provider Last Rate Last Dose  . acetaminophen (TYLENOL) tablet 650 mg  650 mg Oral Q4H PRN Patrecia Pour, NP      . alum & mag hydroxide-simeth (MAALOX/MYLANTA) 200-200-20 MG/5ML suspension 30 mL  30 mL Oral PRN Patrecia Pour, NP      . FLUoxetine (PROZAC) capsule 10 mg  10 mg Oral Daily Patrecia Pour, NP      . ibuprofen (ADVIL,MOTRIN) tablet 600 mg  600 mg Oral Q8H PRN Patrecia Pour, NP      . magnesium hydroxide (MILK OF MAGNESIA) suspension 30 mL  30 mL Oral Daily PRN Patrecia Pour, NP      . nicotine (NICODERM CQ - dosed in mg/24 hours) patch 21 mg  21 mg Transdermal Daily Patrecia Pour, NP      . ondansetron [facility]) tablet 4  mg  4 mg Oral Q8H PRN Patrecia Pour, NP      . traZODone (DESYREL) tablet 50 mg  50 mg Oral QHS Patrecia Pour, NP       PTA Medications: Prescriptions Prior to Admission  Medication Sig Dispense Refill Last Dose  . ibuprofen (ADVIL,MOTRIN) 200 MG tablet Take 400 mg by mouth every 6 (six) hours as needed for headache, mild pain or moderate pain.   Past Week at Unknown time    Musculoskeletal: Strength & Muscle Tone: within normal limits Gait & Station: normal Patient leans: N/A  Psychiatric Specialty Exam: Physical Exam  ROS  Blood pressure 122/90, pulse 83, temperature 98.4 F (36.9 C), temperature source Oral, resp. rate 18, height 5' 7.01" (1.702 m), weight 91.2 kg (201 lb), SpO2 100 %.Body mass index is 31.47 kg/m.  General Appearance: Casual  Eye Contact:  Good  Speech:  Clear and Coherent  Volume:  Normal  Mood:  Anxious  Affect:  Appropriate  Thought Process:  Coherent  Orientation:  Full (Time, Place, and Person)  Thought Content:  Logical  Suicidal Thoughts:  No  Homicidal Thoughts:  No  Memory:  Immediate;   Good  Judgement:  Intact  Insight:  Fair  Psychomotor Activity:  Restlessness  Concentration:  Concentration: Good  Recall:  Good  Fund of Knowledge:  Good  Language:  Good  Akathisia:  Negative  Handed:  Right  AIMS (if indicated):     Assets:  Communication Skills Desire for Improvement Social Support Talents/Skills  ADL's:  Intact  Cognition:  WNL  Sleep:  Number of Hours: 6.5    Treatment Plan Summary: Daily contact with patient to assess and evaluate symptoms and progress in treatment, Medication management and Plan 1) need to collect additional data from mother etc to clarify significance of current episode with regards to mood symptoms as well as SA issues. Patient is currently refusing antidepression medication at this point  Observation Level/Precautions:  15 minute checks  Laboratory:  CBC Chemistry Profile Folic  Acid GGT UDS UA Vitamin B-12  Psychotherapy:    Medications:    Consultations:    Discharge Concerns:    Estimated LOS:  Other:     Physician Treatment Plan for Primary Diagnosis: <principal problem not specified> Long Term Goal(s): Improvement in symptoms so as ready for discharge  Short Term Goals: Ability to identify changes in lifestyle to reduce recurrence of condition will improve, Ability to verbalize feelings will improve, Ability to disclose and discuss suicidal ideas, Ability to demonstrate self-control will improve, Ability to identify and develop effective coping behaviors will improve, Ability to maintain clinical measurements within normal limits will improve, Compliance with prescribed medications will improve and Ability to identify triggers associated with substance abuse/mental health issues will improve  Physician Treatment Plan for Secondary Diagnosis: Active Problems:   Major depressive disorder, single episode, severe without psychosis ([place])  Long Term Goal(s): Improvement in symptoms so as ready for discharge  Short Term Goals: Ability to identify changes in lifestyle to reduce recurrence of condition will improve, Ability to verbalize feelings will improve, Ability to disclose and discuss suicidal ideas, Ability to demonstrate self-control will improve, Ability to identify and develop effective coping behaviors will improve, Ability to maintain clinical measurements within normal limits will improve, Compliance with prescribed medications will improve and Ability to identify triggers associated with substance abuse/mental health issues will improve  I certify that inpatient services furnished can reasonably be expected to improve the patient's condition.    Sharma Covert, MD 2/21/201811:12 AM

## 2016-11-10 NOTE — BHH Counselor (Signed)
Adult Comprehensive Assessment  Patient ID: Kyle Sullivan, male   DOB: [birth year], 31 y.o.   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 Employment / Job issues: works at [employer] day. worried about losing job Family Relationships: close to mother and sister/brother. plans to get restraining order against woman he has been dating Surveyor, quantity / Lack of resources (include bankruptcy): mother helps him financially Housing / Lack of housing: plans to move in with his mother at discharge Physical health (include injuries & life threatening diseases): none identified Social relationships: some supportive and positive friends in community Substance abuse: reports recreationsal/rare alcohol use and marijuana use. "I don't abuse drugs or alcohol at all."  Bereavement / Loss: none identified.   Living/Environment/Situation:  Living Arrangements: Spouse/significant other Living conditions (as described by patient or guardian): has been living with on again off again girlfriend for 3 years. plans to move out at discharge and live with his mother in [place], [place]. How long has patient lived in current situation?: few years. "toxic environment and relationship." What is atmosphere in current home: Abusive, Chaotic, Temporary  Family History:  Marital status: Single Are you sexually active?: Yes What is your sexual orientation?: heterosexual Has your sexual activity been affected by drugs, alcohol, medication, or emotional stress?: n/a  Does patient have children?: Yes How many children?: 1 How is patient's relationship with their children?: 6yo daughter lives near [place], [place]. "my next goal is to legally find a way to see her more often." "right now I have to rely on my ex and if she feels like letting me see my daughter."   Childhood History:  By whom was/is the  patient raised?: Mother, Mother/father and step-parent Additional childhood history information: mother and stepfather raised patient. "I talked to my biological dad twice and it didn't go well so I never pursued a relationship with him."  Description of patient's relationship with caregiver when they were a child: close to mother; strained with stepfather "I never let him get close. I didn't think I was his responsibility."  Patient's description of current relationship with people who raised him/her: close to mother; closer to stepfather who is no longer married to his mother. still no relationship with biological father. How were you disciplined when you got in trouble as a child/adolescent?: n/a  Does patient have siblings?: Yes Number of Siblings: 2 Description of patient's current relationship with siblings: older brother and younger sister. close to both.  Did patient suffer any verbal/emotional/physical/sexual abuse as a child?: No Did patient suffer from severe childhood neglect?: No Has patient ever been sexually abused/assaulted/raped as an adolescent or adult?: No Was the patient ever a victim of a crime or a disaster?: No Witnessed domestic violence?: No Has patient been effected by domestic violence as an adult?: No  Education:  Highest grade of school patient has completed: Engineer, maintenance (IT) degree  Currently a student?: No Learning disability?: No  Employment/Work Situation:   Employment situation: Employed Where is patient currently employed?: [employer] How long has patient been employed?: "just a day." pt worried that he is missing work and will lose job if not discharged in the next few days.  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 He does not have access to this gun or any other firearms.)  Financial Resources:   Financial resources: Income from employment, Support from parents / caregiver, Media plannerrivate insurance (private insurance will lapse in a few weeks-from previous employer) Does patient have a representative payee or guardian?: No  Alcohol/Substance Abuse:   What has been your use of drugs/alcohol within the last 12 months?: on weekends pt reports some drinking and marijuana use "I drink socially." Pt denies alcohol or substance abuse.  If attempted suicide, did drugs/alcohol play a role in this?: No (Pt reports he and ex girlfriend have toxic relationship--"I made a suicidal statement when I was very upset with her. I didn't mean it. I love myself." ) Alcohol/Substance Abuse Treatment Hx: Denies past history If yes, describe treatment: n/a  Has alcohol/substance abuse ever caused legal problems?: No  Social Support System:   Patient's Community Support System: Good Describe Community Support System: pt reports good amount of social supports that are positive.  Type of faith/religion: christian How does patient's faith help to cope with current illness?: prayer; church family  Leisure/Recreation:   Leisure and Hobbies: spending time with daughter; family   Strengths/Needs:   What things does the patient do well?: insight; intelligent; motivated to get back "on the right path."  In what areas does patient struggle / problems for patient: "I made bad choices with this past relationship."   Discharge Plan:   Does patient have access to transportation?: Yes (car) Will patient be returning to same living situation after discharge?: No Plan for living situation after discharge: pt [place] mother [place] at [place].  Currently receiving community mental health services: No If no, would patient like referral for services when discharged?: No (however, pt agreed to sign ROI for [facility] "in case I need to talk to someone in the future. I don't want to take medication though. I"m not depressed and don't want to be medicated." ) Does patient have financial barriers related to discharge medications?: No  Summary/Recommendations:   Summary and Recommendations (to be completed by the evaluator): Patient is 31 yo male living in [place], [place] He has a diagnosis of: MDD single episode. He presents to the hospital involuntarily after alleged SI statements and being found with gun in his possession that he states is his ex girlfriend's gun. Patient denies SI and states that his relationship issues led him to this point. 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 Pt signed ROI for [facility] and plans to follow-up for counseling if needed post discharge. He is not open to medication management and does not feel that he has a mental health or substasnce abuse problem. Patient reports "social drinking and marijuana use" occassionaly. He denies SI/HI/AVH. Recommendations for patient include: crisis stabilization, therapeutic milieu, encourage group attendance and participation, and development of comprehensive mental wellness plan.   Ledell PeoplesHeather N Smart LCSW 11/10/2016 11:23 AM

## 2016-11-10 NOTE — Progress Notes (Signed)
Patient signed consent for voluntary admission.

## 2016-11-10 NOTE — Progress Notes (Signed)
  [facility] Discharge Plan :  Will you be returning to the same living situation after discharge:  Yes,  home with his mother At discharge, do you have transportation home?: Yes,  pt's mother will pick him up at 1:15PM on Thursday, 11/11/16. CSW confirmed plan with his mother. Do you have the ability to pay for your medications: Yes,  C[employer]-coverage will end "at the end of the month" per pt. mental health  Release of information consent forms completed and submitted to medical records by CSW.  Patient to Follow up at: Follow-up Information    [facility] Follow up.   Specialty:  Behavioral Health Why:  Walk in within 7 days of hospital discharge to be seen for follow-up. Walk in hours: 8am-9am Monday through Friday. Please bring photo ID/insurance card if you have it. Thank you.  Contact information: [address] [place] [id number] [phone]           Next level of care provider has access to [facility] Link:no  Safety Planning and Suicide Prevention discussed: Yes,  SPE completed with pt's mother.  Have you used any form of tobacco in the last 30 days? (Cigarettes, Smokeless Tobacco, Cigars, and/or Pipes): Yes  Has patient been referred to the Quitline?: Patient refused referral  Patient has been referred for addiction treatment: Yes  Jamal Pavon N Smart LCSW 11/10/2016, 3:51 PM

## 2016-11-10 NOTE — Progress Notes (Signed)
Patient has signed in "Voluntary"

## 2016-11-10 NOTE — Progress Notes (Addendum)
Patient ID: Kyle Sullivan, male   DOB: [birth year], 31 y.o.   MRN: [id number]  Pt currently presents with a euphoric affect and anxious behavior. Pt speech is rapid and pressured. Pt reports to Clinical research associatewriter that their goal is to "go back to work tomorrow so I can pay for my baby." Pt states "I am fine, there is nothing wrong with me, it is all my ex girlfriend." Pt reports good sleep with current medication regimen.   Pt provided with medications per providers orders. Pt's labs and vitals were monitored throughout the night. Pt given a 1:1 about emotional and mental status. Pt supported and encouraged to express concerns and questions.   Pt's safety ensured with 15 minute and environmental checks. Pt currently denies SI/HI and A/V hallucinations. Pt verbally agrees to seek staff if SI/HI or A/VH occurs and to consult with staff before acting on any harmful thoughts. Will continue POC.

## 2016-11-10 NOTE — Progress Notes (Signed)
Recreation Therapy Notes  Date: 11/10/16 Time: 0930 Location: 300 Hall Group Room  Group Topic: Stress Management  Goal Area(s) Addresses:  Patient will verbalize importance of using healthy stress management.  Patient will identify positive emotions associated with healthy stress management.   Intervention: Stress Management  Activity :  Meditation.  LRT introduced the stress management technique of meditation.  LRT played a meditation from the Calm app to allows patients to engage and learn the benefits of meditation.  Patiens were to follow along as the meditation played to engage in the technique.  Education:  Stress Management, Discharge Planning.   Education Outcome: Acknowledges edcuation/In group clarification offered/Needs additional education  Clinical Observations/Feedback: Pt did not attend group.    Jeramia Saleeby, LRT/CTRS         Victorhugo Preis A 11/10/2016 12:39 PM 

## 2016-11-10 NOTE — Tx Team (Signed)
Interdisciplinary Treatment and Diagnostic Plan Update  11/10/2016 Time of Session: 0930 Kyle Sullivan MRN: [id number]  Principal Diagnosis: MDD Secondary Diagnoses: Active Problems:   Major depressive disorder, single episode, severe without psychosis (HCC)   Current Medications:  Current Facility-Administered Medications  Medication Dose Route Frequency Provider Last Rate Last Dose  . acetaminophen (TYLENOL) tablet 650 mg  650 mg Oral Q4H PRN Charm Rings, NP      . alum & mag hydroxide-simeth (MAALOX/MYLANTA) 200-200-20 MG/5ML suspension 30 mL  30 mL Oral PRN Charm Rings, NP      . FLUoxetine (PROZAC) capsule 10 mg  10 mg Oral Daily Charm Rings, NP      . ibuprofen (ADVIL,MOTRIN) tablet 600 mg  600 mg Oral Q8H PRN Charm Rings, NP      . magnesium hydroxide (MILK OF MAGNESIA) suspension 30 mL  30 mL Oral Daily PRN Charm Rings, NP      . nicotine (NICODERM CQ - dosed in mg/24 hours) patch 21 mg  21 mg Transdermal Daily Charm Rings, NP      . ondansetron [facility]) tablet 4 mg  4 mg Oral Q8H PRN Charm Rings, NP      . traZODone (DESYREL) tablet 50 mg  50 mg Oral QHS Charm Rings, NP       PTA Medications: Prescriptions Prior to Admission  Medication Sig Dispense Refill Last Dose  . ibuprofen (ADVIL,MOTRIN) 200 MG tablet Take 400 mg by mouth every 6 (six) hours as needed for headache, mild pain or moderate pain.   Past Week at Unknown time    Patient Stressors: Financial difficulties Other: Relationship conflict  Patient Strengths: Average or above average intelligence Capable of independent living Communication skills Supportive family/friends  Treatment Modalities: Medication Management, Group therapy, Case management,  1 to 1 session with clinician, Psychoeducation, Recreational therapy.   Physician Treatment Plan for Primary Diagnosis: MDD Long Term Goal(s): Improvement in symptoms so as ready for discharge Improvement in symptoms so as ready for  discharge   Short Term Goals: Ability to identify changes in lifestyle to reduce recurrence of condition will improve Ability to verbalize feelings will improve Ability to disclose and discuss suicidal ideas Ability to demonstrate self-control will improve Ability to identify and develop effective coping behaviors will improve Ability to maintain clinical measurements within normal limits will improve Compliance with prescribed medications will improve Ability to identify triggers associated with substance abuse/mental health issues will improve Ability to identify changes in lifestyle to reduce recurrence of condition will improve Ability to verbalize feelings will improve Ability to disclose and discuss suicidal ideas Ability to demonstrate self-control will improve Ability to identify and develop effective coping behaviors will improve Ability to maintain clinical measurements within normal limits will improve Compliance with prescribed medications will improve Ability to identify triggers associated with substance abuse/mental health issues will improve  Medication Management: Evaluate patient's response, side effects, and tolerance of medication regimen.  Therapeutic Interventions: 1 to 1 sessions, Unit Group sessions and Medication administration.  Evaluation of Outcomes: Progressing  Physician Treatment Plan for Secondary Diagnosis: Active Problems:   Major depressive disorder, single episode, severe without psychosis (HCC)  Long Term Goal(s): Improvement in symptoms so as ready for discharge Improvement in symptoms so as ready for discharge   Short Term Goals: Ability to identify changes in lifestyle to reduce recurrence of condition will improve Ability to verbalize feelings will improve Ability to disclose and discuss suicidal ideas  Ability to demonstrate self-control will improve Ability to identify and develop effective coping behaviors will improve Ability to maintain  clinical measurements within normal limits will improve Compliance with prescribed medications will improve Ability to identify triggers associated with substance abuse/mental health issues will improve Ability to identify changes in lifestyle to reduce recurrence of condition will improve Ability to verbalize feelings will improve Ability to disclose and discuss suicidal ideas Ability to demonstrate self-control will improve Ability to identify and develop effective coping behaviors will improve Ability to maintain clinical measurements within normal limits will improve Compliance with prescribed medications will improve Ability to identify triggers associated with substance abuse/mental health issues will improve     Medication Management: Evaluate patient's response, side effects, and tolerance of medication regimen.  Therapeutic Interventions: 1 to 1 sessions, Unit Group sessions and Medication administration.  Evaluation of Outcomes: Progressing   RN Treatment Plan for Primary Diagnosis: MDD Long Term Goal(s): Knowledge of disease and therapeutic regimen to maintain health will improve  Short Term Goals: Ability to remain free from injury will improve, Ability to verbalize feelings will improve and Ability to disclose and discuss suicidal ideas  Medication Management: RN will administer medications as ordered by provider, will assess and evaluate patient's response and provide education to patient for prescribed medication. RN will report any adverse and/or side effects to prescribing provider.  Therapeutic Interventions: 1 on 1 counseling sessions, Psychoeducation, Medication administration, Evaluate responses to treatment, Monitor vital signs and CBGs as ordered, Perform/monitor CIWA, COWS, AIMS and Fall Risk screenings as ordered, Perform wound care treatments as ordered.  Evaluation of Outcomes: Progressing   LCSW Treatment Plan for Primary Diagnosis: MDD Long Term Goal(s):  Safe transition to appropriate next level of care at discharge, Engage patient in therapeutic group addressing interpersonal concerns.  Short Term Goals: Engage patient in aftercare planning with referrals and resources, Facilitate patient progression through stages of change regarding substance use diagnoses and concerns and Identify triggers associated with mental health/substance abuse issues  Therapeutic Interventions: Assess for all discharge needs, 1 to 1 time with Social worker, Explore available resources and support systems, Assess for adequacy in community support network, Educate family and significant other(s) on suicide prevention, Complete Psychosocial Assessment, Interpersonal group therapy.  Evaluation of Outcomes: Progressing   Progress in Treatment: Attending groups: Yes. Participating in groups: Yes. Taking medication as prescribed: Yes. Toleration medication: Yes. Family/Significant other contact made: No, will contact:  family member if patient consents Patient understands diagnosis: Yes. Discussing patient identified problems/goals with staff: Yes. Medical problems stabilized or resolved: Yes. Denies suicidal/homicidal ideation: Yes. Issues/concerns per patient self-inventory: n/a Other:n/a   New problem(s) identified: No, Describe:  n/a  New Short Term/Long Term Goal(s): Detox; medication stabilization; development of comprehensive mental wellness/sobriety plan.   Discharge Plan or Barriers: CSW assessing for appropriate referrals.   Reason for Continuation of Hospitalization: Depression Medication stabilization Withdrawal symptoms  Estimated Length of Stay: 3-5 days   Attendees: Patient: 11/10/2016 2:54 PM  Physician: Dr. Jola Babinskilary MD 11/10/2016 2:54 PM  Nursing:  11/10/2016 2:54 PM  RN Care Manager: Onnie BoerJennifer Clark CM 11/10/2016 2:54 PM  Social Worker: Chartered loss adjusterHeather Smart, LCSW; Donnelly StagerLynn Bryant LCSWA 11/10/2016 2:54 PM  Recreational Therapist: Juliann ParesX 11/10/2016 2:54 PM  Other:   11/10/2016 2:54 PM  Other:  11/10/2016 2:54 PM  Other: 11/10/2016 2:54 PM    Scribe for Treatment Team: Ledell PeoplesHeather N Smart, LCSW 11/10/2016 2:54 PM

## 2016-11-10 NOTE — [facility] Suicide Risk Assessment (Signed)
[facility]   Nursing information obtained from:   Patient and ED notes Demographic factors:    Current Mental Status:    Loss Factors:    Historical Factors:    Risk Reduction Factors:     Total Time spent with patient: 45 minutes Principal Problem: suicidality, depression Diagnosis:   Patient Active Problem List   Diagnosis Date Noted  . Major depressive disorder, single episode, severe without psychosis (HCC) [F32.2] 11/09/2016   Subjective Data: Patient is a 31 YO male admitted under IVC for suicidality  Continued Clinical Symptoms:  Alcohol Use Disorder Identification Test Final Score (AUDIT): 3 The "Alcohol Use Disorders Identification Test", Guidelines for Use in Primary Care, Second Edition.  World Science writerHealth Organization [facility](WHO). Score between 0-7:  no or low risk or alcohol related problems. Score between 8-15:  moderate risk of alcohol related problems. Score between 16-19:  high risk of alcohol related problems. Score 20 or above:  warrants further diagnostic evaluation for alcohol dependence and treatment.   CLINICAL FACTORS:   Alcohol/Substance Abuse/Dependencies   Musculoskeletal: Strength & Muscle Tone: within normal limits Gait & Station: normal Patient leans: Right  Psychiatric Specialty Exam: Physical Exam  ROS  Blood pressure 122/90, pulse 83, temperature 98.4 F (36.9 C), temperature source Oral, resp. rate 18, height 5' 7.01" (1.702 m), weight 91.2 kg (201 lb), SpO2 100 %.Body mass index is 31.47 kg/m.  General Appearance: Casual  Eye Contact:  Good  Speech:  Clear and Coherent  Volume:  Normal  Mood:  Anxious  Affect:  Appropriate  Thought Process:  Coherent  Orientation:  Full (Time, Place, and Person)  Thought Content:  Logical  Suicidal Thoughts:  No  Homicidal Thoughts:  No  Memory:  Immediate;   Good  Judgement:  Intact  Insight:  Lacking  Psychomotor Activity:  Increased  Concentration:  Concentration: Good   Recall:  Good  Fund of Knowledge:  Good  Language:  Good  Akathisia:  Negative  Handed:  Right  AIMS (if indicated):     Assets:  Communication Skills Desire for Improvement Housing Physical Health Social Support  ADL's:  Intact  Cognition:  WNL  Sleep:  Number of Hours: 6.5      COGNITIVE FEATURES THAT CONTRIBUTE TO RISK:  None    SUICIDE RISK:   Minimal: No identifiable suicidal ideation.  Patients presenting with no risk factors but with morbid ruminations; may be classified as minimal risk based on the severity of the depressive symptoms  PLAN OF CARE: 1) collect additional information from mother etc about history of Si, depression, substance abuse  I certify that inpatient services furnished can reasonably be expected to improve the patient's condition.   Antonieta PertGreg Lawson Clary, MD 11/10/2016, 11:08 AM

## 2016-11-10 NOTE — [facility] Group Notes (Signed)
[facility] LCSW Group Therapy  11/10/2016 3:50 PM  Type of Therapy:  Group Therapy  Participation Level:  Active  Participation Quality:  Attentive  Affect:  Appropriate  Cognitive:  Alert and Oriented  Insight:  Improving  Engagement in Therapy:  Engaged  Modes of Intervention:  Confrontation, Discussion, Education, Problem-solving, Socialization and Support  Summary of Progress/Problems: Today's Topic: Overcoming Obstacles. Patients identified one short term goal and potential obstacles in reaching this goal. Patients processed barriers involved in overcoming these obstacles. Patients identified steps necessary for overcoming these obstacles and explored motivation (internal and external) for facing these difficulties head on.   Loucinda Croy N Smart LCSW 11/10/2016, 3:50 PM

## 2016-11-11 MED ORDER — FLUOXETINE HCL 10 MG PO CAPS: 10.0000 mg | ORAL_CAPSULE | Freq: Every day | ORAL | 0 refills | Status: AC

## 2016-11-11 MED ORDER — NICOTINE 21 MG/24HR TD PT24: 21.0000 mg | MEDICATED_PATCH | Freq: Every day | TRANSDERMAL | 0 refills | Status: AC

## 2016-11-11 MED ORDER — TRAZODONE HCL 50 MG PO TABS: 50.0000 mg | ORAL_TABLET | Freq: Every day | ORAL | 0 refills | Status: AC

## 2016-11-11 MED ORDER — IBUPROFEN 200 MG PO TABS: 400.0000 mg | ORAL_TABLET | Freq: Four times a day (QID) | ORAL | 0 refills | Status: AC | PRN

## 2016-11-11 NOTE — Progress Notes (Addendum)
Patient discharged per physician order; patient denies SI/HI and A/V hallucinations; patient received prescriptions,  AVS,suicide risk assessment note, and transition record given to the patient after it was reviewed; patient had no other questions or concerns at this time; patient verbalized and signed that all belongings were returned; patient left the unit ambulatory 

## 2016-11-11 NOTE — Tx Team (Signed)
Interdisciplinary Treatment and Diagnostic Plan Update  11/11/2016 Time of Session: 0930 Kyle Sullivan MRN: [id number]  Principal Diagnosis: MDD Secondary Diagnoses: Active Problems:   Major depressive disorder, single episode, severe without psychosis ([place])   Current Medications:  Current Facility-Administered Medications  Medication Dose Route Frequency Provider Last Rate Last Dose  . acetaminophen (TYLENOL) tablet 650 mg  650 mg Oral Q4H PRN Patrecia Pour, NP      . alum & mag hydroxide-simeth (MAALOX/MYLANTA) 200-200-20 MG/5ML suspension 30 mL  30 mL Oral PRN Patrecia Pour, NP      . FLUoxetine (PROZAC) capsule 10 mg  10 mg Oral Daily Patrecia Pour, NP      . ibuprofen (ADVIL,MOTRIN) tablet 600 mg  600 mg Oral Q8H PRN Patrecia Pour, NP      . magnesium hydroxide (MILK OF MAGNESIA) suspension 30 mL  30 mL Oral Daily PRN Patrecia Pour, NP      . nicotine (NICODERM CQ - dosed in mg/24 hours) patch 21 mg  21 mg Transdermal Daily Patrecia Pour, NP      . ondansetron [facility]) tablet 4 mg  4 mg Oral Q8H PRN Patrecia Pour, NP      . traZODone (DESYREL) tablet 50 mg  50 mg Oral QHS Patrecia Pour, NP       PTA Medications: Prescriptions Prior to Admission  Medication Sig Dispense Refill Last Dose  . ibuprofen (ADVIL,MOTRIN) 200 MG tablet Take 400 mg by mouth every 6 (six) hours as needed for headache, mild pain or moderate pain.   Past Week at Unknown time    Patient Stressors: Financial difficulties Other: Relationship conflict  Patient Strengths: Average or above average intelligence Capable of independent living Communication skills Supportive family/friends  Treatment Modalities: Medication Management, Group therapy, Case management,  1 to 1 session with clinician, Psychoeducation, Recreational therapy.   Physician Treatment Plan for Primary Diagnosis: MDD Long Term Goal(s): Improvement in symptoms so as ready for discharge Improvement in symptoms so as ready for  discharge   Short Term Goals: Ability to identify changes in lifestyle to reduce recurrence of condition will improve Ability to verbalize feelings will improve Ability to disclose and discuss suicidal ideas Ability to demonstrate self-control will improve Ability to identify and develop effective coping behaviors will improve Ability to maintain clinical measurements within normal limits will improve Compliance with prescribed medications will improve Ability to identify triggers associated with substance abuse/mental health issues will improve Ability to identify changes in lifestyle to reduce recurrence of condition will improve Ability to verbalize feelings will improve Ability to disclose and discuss suicidal ideas Ability to demonstrate self-control will improve Ability to identify and develop effective coping behaviors will improve Ability to maintain clinical measurements within normal limits will improve Compliance with prescribed medications will improve Ability to identify triggers associated with substance abuse/mental health issues will improve  Medication Management: Evaluate patient's response, side effects, and tolerance of medication regimen.  Therapeutic Interventions: 1 to 1 sessions, Unit Group sessions and Medication administration.  Evaluation of Outcomes: Met  Physician Treatment Plan for Secondary Diagnosis: Active Problems:   Major depressive disorder, single episode, severe without psychosis ([place])  Long Term Goal(s): Improvement in symptoms so as ready for discharge Improvement in symptoms so as ready for discharge   Short Term Goals: Ability to identify changes in lifestyle to reduce recurrence of condition will improve Ability to verbalize feelings will improve Ability to disclose and discuss suicidal ideas  Ability to demonstrate self-control will improve Ability to identify and develop effective coping behaviors will improve Ability to maintain  clinical measurements within normal limits will improve Compliance with prescribed medications will improve Ability to identify triggers associated with substance abuse/mental health issues will improve Ability to identify changes in lifestyle to reduce recurrence of condition will improve Ability to verbalize feelings will improve Ability to disclose and discuss suicidal ideas Ability to demonstrate self-control will improve Ability to identify and develop effective coping behaviors will improve Ability to maintain clinical measurements within normal limits will improve Compliance with prescribed medications will improve Ability to identify triggers associated with substance abuse/mental health issues will improve     Medication Management: Evaluate patient's response, side effects, and tolerance of medication regimen.  Therapeutic Interventions: 1 to 1 sessions, Unit Group sessions and Medication administration.  Evaluation of Outcomes: Met   RN Treatment Plan for Primary Diagnosis: MDD Long Term Goal(s): Knowledge of disease and therapeutic regimen to maintain health will improve  Short Term Goals: Ability to remain free from injury will improve, Ability to verbalize feelings will improve and Ability to disclose and discuss suicidal ideas  Medication Management: RN will administer medications as ordered by provider, will assess and evaluate patient's response and provide education to patient for prescribed medication. RN will report any adverse and/or side effects to prescribing provider.  Therapeutic Interventions: 1 on 1 counseling sessions, Psychoeducation, Medication administration, Evaluate responses to treatment, Monitor vital signs and CBGs as ordered, Perform/monitor CIWA, COWS, AIMS and Fall Risk screenings as ordered, Perform wound care treatments as ordered.  Evaluation of Outcomes: Met   LCSW Treatment Plan for Primary Diagnosis: MDD Long Term Goal(s): Safe transition  to appropriate next level of care at discharge, Engage patient in therapeutic group addressing interpersonal concerns.  Short Term Goals: Engage patient in aftercare planning with referrals and resources, Facilitate patient progression through stages of change regarding substance use diagnoses and concerns and Identify triggers associated with mental health/substance abuse issues  Therapeutic Interventions: Assess for all discharge needs, 1 to 1 time with Social worker, Explore available resources and support systems, Assess for adequacy in community support network, Educate family and significant other(s) on suicide prevention, Complete Psychosocial Assessment, Interpersonal group therapy.  Evaluation of Outcomes: Met   Progress in Treatment: Attending groups: Yes. Participating in groups: Yes. Taking medication as prescribed: Yes. Toleration medication: Yes. Family/Significant other contact made: SPE completed with pt's mother.  Patient understands diagnosis: Yes. Discussing patient identified problems/goals with staff: Yes. Medical problems stabilized or resolved: Yes. Denies suicidal/homicidal ideation: Yes. Issues/concerns per patient self-inventory: n/a Other:n/a   New problem(s) identified: No, Describe:  n/a  New Short Term/Long Term Goal(s): Detox; medication stabilization; development of comprehensive mental wellness/sobriety plan.   Discharge Plan or Barriers: Pt not open to medication management; agreeable to counseling referral--[facility] signed. Walk in appt. Pt also given [facility] pamphlet for additional resource.   Reason for Continuation of Hospitalization: none  Estimated Length of Stay: discharge today   Attendees: Patient: 11/11/2016 8:19 AM  Physician: Dr. Mallie Darting MD 11/11/2016 8:19 AM  Nursing: Gari Crown RN; Roni RN 11/11/2016 8:19 AM  RN Care Manager: Lars Pinks CM 11/11/2016 8:19 AM  Social Worker: Maxie Better, LCSW; Adriana Reams LCSW 11/11/2016 8:19 AM  Recreational Therapist: Rhunette Croft 11/11/2016 8:19 AM  Other: Lindell Spar NP; May Augustin NP 11/11/2016 8:19 AM  Other:  11/11/2016 8:19 AM  Other: 11/11/2016 8:19 AM    Scribe  for Treatment Team: Mart, LCSW 11/11/2016 8:19 AM

## 2016-11-11 NOTE — Discharge Summary (Signed)
Physician Discharge Summary Note  Patient:  Kyle Sullivan is an 31 y.o., male MRN:  [id number] DOB:  1985/11/11 Patient phone:  [phone] (home)  Patient address:   [address] [place] [address],   Total Time spent with patient: Greater than 30 minutes  Date of Admission:  11/09/2016  Date of Discharge: 11-11-16  Reason for Admission:    Principal Problem: Major depressive disorder,  Discharge Diagnoses: Patient Active Problem List   Diagnosis Date Noted  . Major depressive disorder, single episode, severe without psychosis (HCC) [F32.2] 11/09/2016   Past Psychiatric History: Major depressive disorder, THC dependence.  Past Medical History:  Past Medical History:  Diagnosis Date  . Seizure [facility])    reports last was when he was 70-52 years old   History reviewed. No pertinent surgical history.  Family History: History reviewed. No pertinent family history.  Family Psychiatric  History: See H&P  Social History:  History  Alcohol Use  . Yes    Comment: Says he drinks 2-3x/week; 1-2 drinks per sitting     History  Drug Use No    Comment: pt says he "used to" smoke marijuana; UDS positive for it    Social History   Social History  . Marital status: Divorced    Spouse name: N/A  . Number of children: N/A  . Years of education: N/A   Social History Main Topics  . Smoking status: Current Every Day Smoker    Packs/day: 0.10    Types: Cigarettes  . Smokeless tobacco: Never Used     Comment: pt declines patch/gum  . Alcohol use Yes     Comment: Says he drinks 2-3x/week; 1-2 drinks per sitting  . Drug use: No     Comment: pt says he "used to" smoke marijuana; UDS positive for it  . Sexual activity: Yes   Other Topics Concern  . None   Social History Narrative  . None   Hospital Course:  Patient is a 31 YO male with essentially no PPHx who presented under IVC after being reported to be intoxicated and threatening suicide. The patient stated that  he has been living with an older male over the last 2 years. He reports that "she has psychiatric problems" and they recently had been having problems and he was going to move out. She had not left the condo yet because he had financial involment with the mortgage. He had been gaming with his friends and drinking alcohol. He became intoxicated and texted a message to his GF about wanting to kill himself. He stated that he was intoxicated at the time and does not remember things very well. GF reported his SI and police found a weapon in his car. He was taken under IVC. The patient stated he was not depressed, does not drink alcohol regularly and this was the reason it affected him greatly. He denied and previous hospitalizations or treatment for psychiatric reasons.   Rodney's stay in this hospital was rather very brief. He was admitted to the hospital with his UDS positive for [facility] & a BAL of < 5. However, his reason for admission was due to suicidal ideations/threats. He admitted having been drinking while playing game with friends. His girlfriend reported that Shyne tested her suicide threats & some weapon was found in his car by the cops. Although denied feeling or being suicidal, Nero also denied being depressed.  After his admission assessment, Billye was started on Prozac 10 mg for depression, Nicotine  patch 21 mg for smoking cessation & Trazodone 50 mg for insomnia. He presented no other previously existing medical issues that required treatment. He tolerated his treatment regimen without any adverse effects or reactions.    During the follow-up care assessment this morning, Jalyn presented with a good affect, good eye contact, is alert & oriented x 3. He is aware of situation & able to make concrete decisions/requests. He has asked to be discharged today. He presents today as mentally & medically stable, denies any SIHI, AVH, delusional thoughts or paranoia. And because there is no clinical  criteria to keep Kandon admitted to the hospital, he is being discharged as requested to his place of residence. He is committed to abstaining from substances.   Upon discharge, Akeen appeared much more in control of his mood & behavior. His symptoms were reported as significantly improved or completely resolved There are currently, no active SI plans or intent, AVH, delusional thoughts or paranoia. He is going to pursue outpatient treatment mental health care on an outpatient basis as noted below. He was provided with prescriptions & a 7 days worth, supply samples of his [facility]. [facility] in no apparent distress. Transportation per family.  Physical Findings: AIMS: Facial and Oral Movements Muscles of Facial Expression: None, normal Lips and Perioral Area: None, normal Jaw: None, normal Tongue: None, normal,Extremity Movements Upper (arms, wrists, hands, fingers): None, normal Lower (legs, knees, ankles, toes): None, normal, Trunk Movements Neck, shoulders, hips: None, normal, Overall Severity Severity of abnormal movements (highest score from questions above): None, normal Incapacitation due to abnormal movements: None, normal Patient's awareness of abnormal movements (rate only patient's report): No Awareness, Dental Status Current problems with teeth and/or dentures?: No Does patient usually wear dentures?: No  CIWA:  CIWA-Ar Total: 0 COWS:     Musculoskeletal: Strength & Muscle Tone: within normal limits Gait & Station: normal Patient leans: N/A  Psychiatric Specialty Exam: Physical Exam  Constitutional: He appears well-developed.  HENT:  Head: Normocephalic.  Eyes: Pupils are equal, round, and reactive to light.  Neck: Normal range of motion.  Cardiovascular: Normal rate.   Respiratory: Effort normal.  Genitourinary:  Genitourinary Comments: Deferred  Musculoskeletal: Normal range of motion.  Neurological: He is alert.   Skin: Skin is warm.    Review of Systems  Constitutional: Negative.   HENT: Negative.   Eyes: Negative.   Respiratory: Negative.   Cardiovascular: Negative.   Gastrointestinal: Negative.   Genitourinary: Negative.   Musculoskeletal: Negative.   Skin: Negative.   Neurological: Negative.   Endo/Heme/Allergies: Negative.   Psychiatric/Behavioral: Positive for depression (Stable) and substance abuse (Hx. THC dependence). Negative for hallucinations, memory loss and suicidal ideas. The patient has insomnia (Stable). The patient is not nervous/anxious.     Blood pressure 131/89, pulse 80, temperature 98.4 F (36.9 C), temperature source Oral, resp. rate 16, height 5' 7.01" (1.702 m), weight 91.2 kg (201 lb), SpO2 100 %.Body mass index is 31.47 kg/m.  See Md's SRA   Have you used any form of tobacco in the last 30 days? (Cigarettes, Smokeless Tobacco, Cigars, and/or Pipes): Yes  Has this patient used any form of tobacco in the last 30 days? (Cigarettes, Smokeless Tobacco, Cigars, and/or Pipes): Yes, provided with nicotine patch prescription upon discharge.  Blood Alcohol level:  Lab Results  Component Value Date   ETH <5 11/09/2016   Metabolic Disorder Labs:  No results found for: HGBA1C, MPG No results found for:  PROLACTIN No results found for: CHOL, TRIG, HDL, CHOLHDL, VLDL, LDLCALC  See Psychiatric Specialty Exam and Suicide Risk Assessment completed by Attending Physician prior to discharge.  Discharge destination:  Home  Is patient on multiple antipsychotic therapies at discharge:  No   Has Patient had three or more failed trials of antipsychotic monotherapy by history:  No  Recommended Plan for Multiple Antipsychotic Therapies: NA   Allergies as of 11/11/2016   No Known Allergies     Medication List    TAKE these medications     Indication  FLUoxetine 10 MG capsule Commonly known as:  PROZAC Take 1 capsule (10 mg total) by mouth daily. For depression Start  taking on:  11/12/2016  Indication:  Depression   ibuprofen 200 MG tablet Commonly known as:  ADVIL,MOTRIN Take 2 tablets (400 mg total) by mouth every 6 (six) hours as needed for headache, mild pain or moderate pain.  Indication:  Migraine Headache, Mild to Moderate Pain   nicotine 21 mg/24hr patch Commonly known as:  NICODERM CQ - dosed in mg/24 hours Place 1 patch (21 mg total) onto the skin daily. For smoking cessation Start taking on:  11/12/2016  Indication:  Nicotine Addiction   traZODone 50 MG tablet Commonly known as:  DESYREL Take 1 tablet (50 mg total) by mouth at bedtime. For sleep  Indication:  Trouble Sleeping      Follow-up Information    [facility] Follow up.   Specialty:  Behavioral Health Why:  Walk in within 7 days of hospital discharge to be seen for follow-up. Walk in hours: 8am-9am Monday through Friday. Please bring photo ID/insurance card if you have it. Thank you.  [address] [place] [address] [phone]          Follow-up recommendations: Activity:  As tolerated Diet: As recommended by your primary care doctor. Keep all scheduled follow-up appointments as recommended.  Comments: Patient is instructed prior to discharge to: Take all medications as prescribed by his/her mental healthcare provider. Report any adverse effects and or reactions from the medicines to his/her outpatient provider promptly. Patient has been instructed & cautioned: To not engage in alcohol and or illegal drug use while on prescription medicines. In the event of worsening symptoms, patient is instructed to call the crisis hotline, 911 and or go to the nearest ED for appropriate evaluation and treatment of symptoms. To follow-up with his/her primary care provider for your other medical issues, concerns and or health care needs.   Signed: Sanjuana Kava, NP, PMHNP, FNP-BC 11/11/2016, 10:57 AM

## 2016-11-11 NOTE — [facility] Suicide Risk Assessment (Signed)
[facility]   Principal Problem: relationship issues Discharge Diagnoses:  Patient Active Problem List   Diagnosis Date Noted  . Major depressive disorder, single episode, severe without psychosis (H[facility]) [F32.2] 11/09/2016    Total Time spent with patient: 20 minutes  Musculoskeletal: Strength & Muscle Tone: within normal limits Gait & Station: normal Patient leans: N/A  Psychiatric Specialty Exam: ROS  Blood pressure 131/89, pulse 80, temperature 98.4 F (36.9 C), temperature source Oral, resp. rate 16, height 5' 7.01" (1.702 m), weight 91.2 kg (201 lb), SpO2 100 %.Body mass index is 31.47 kg/m.  General Appearance: Casual  Eye Contact::  Good  Speech:  Clear and Coherent409  Volume:  Normal  Mood:  Euthymic  Affect:  Appropriate  Thought Process:  Coherent  Orientation:  Full (Time, Place, and Person)  Thought Content:  Logical  Suicidal Thoughts:  No  Homicidal Thoughts:  No  Memory:  Immediate;   Good  Judgement:  Intact  Insight:  Fair  Psychomotor Activity:  Normal  Concentration:  Good  Recall:  Good  Fund of Knowledge:Good  Language: Good  Akathisia:  No  Handed:  Right  AIMS (if indicated):     Assets:  Communication Skills Desire for Improvement Housing Resilience  Sleep:  Number of Hours: 6.5  Cognition: WNL  ADL's:  Intact   Mental Status Per Nursing Assessment::   On Admission:     Demographic Factors:  Male  Loss Factors: Loss of significant relationship  Historical Factors: Impulsivity  Risk Reduction Factors:   Sense of responsibility to family  Continued Clinical Symptoms:  Depression:   Impulsivity Alcohol/Substance Abuse/Dependencies  Cognitive Features That Contribute To Risk:  None    Suicide Risk:  Minimal: No identifiable suicidal ideation.  Patients presenting with no risk factors but with morbid ruminations; may be classified as minimal risk based on the severity of the depressive  symptoms  Follow-up Information    [facility]   Specialty:  Behavioral Health Why:  Walk in within 7 days of hospital discharge to be seen for follow-up. Walk in hours: 8am-9am Monday through Friday. Please bring photo ID/insurance card if you have it. Thank you.  Contact information: [address] [place] [address] [phone]           Plan Of Care/Follow-up recommendations:  Activity:  ad lib  Antonieta PertGreg Lawson Ashey Tramontana, MD 11/11/2016, 10:05 AM

## 2016-11-11 NOTE — B[facility] Group Notes (Signed)
The focus of this group is to educate the patient on the purpose and policies of crisis stabilization and provide a format to answer questions about their admission.  The group details unit policies and expectations of patients while admitted. Patient attended group. Patient was engaging and cooperative. 

## 2020-09-30 ENCOUNTER — Encounter (HOSPITAL_COMMUNITY): Payer: Self-pay

## 2020-09-30 ENCOUNTER — Other Ambulatory Visit: Payer: Self-pay

## 2020-09-30 ENCOUNTER — Emergency Department (HOSPITAL_COMMUNITY)
Admission: EM | Admit: 2020-09-30 | Discharge: 2020-09-30 | Disposition: A | Discharge status: Home / Self Care | Payer: HRSA Program | Attending: Emergency Medicine | Admitting: Emergency Medicine

## 2020-09-30 DIAGNOSIS — U071 COVID-19: Secondary | ICD-10-CM | POA: Diagnosis not present

## 2020-09-30 DIAGNOSIS — R6889 Other general symptoms and signs: Secondary | ICD-10-CM

## 2020-09-30 DIAGNOSIS — R059 Cough, unspecified: Secondary | ICD-10-CM | POA: Diagnosis present

## 2020-09-30 DIAGNOSIS — F17210 Nicotine dependence, cigarettes, uncomplicated: Secondary | ICD-10-CM | POA: Diagnosis not present

## 2020-09-30 MED ORDER — BENZONATATE 100 MG PO CAPS: 100.0000 mg | ORAL_CAPSULE | Freq: Three times a day (TID) | ORAL | 0 refills | Status: AC | PRN

## 2020-09-30 NOTE — ED Triage Notes (Signed)
Patient arrived via [facility] with complaints of a headache and generalized body aches. Known covid-19 exposure. Declines taking any OTC medication prior to arrival.

## 2020-09-30 NOTE — Discharge Instructions (Addendum)
You came to the hospital today for evaluation of your flulike symptoms and episode of hyperventilating.  You were swabbed for COVID-19 and these results are pending.  Please create a MyChart account and use this to check the results of your COVID test.  The episode of hyperventilating was most likely a anxiety attack, please follow-up with a primary care provider for further management.    While in the ED your blood pressure was high.  Please follow up with your primary care doctor or the wellness clinic for repeat evaluation as you may need medication.  High blood pressure can cause long term, potentially serious, damage if left untreated.    You have a COVID test pending. Please isolate at home while awaiting your results.  > If your test is negative, stay home until your fever has resolved/your symptoms are improving. > If your test is positive, isolate at home for at least 5 days after the day your symptoms initially began, and THEN at least 24 hours after you are fever-free without the help of medications (Tylenol/acetaminophen and Advil/ibuprofen/Motrin) AND your symptoms are improving.  You can alternate Tylenol/acetaminophen and Advil/ibuprofen/Motrin every 4 hours for sore throat, body aches, headache or fever.  Drink plenty of water.  Use saline nasal spray for congestion. You can take Tessalon every 8 hours as needed for cough. Wash your hands frequently. Please rest as needed with frequent repositioning and ambulation as tolerated.    If you use a CPAP or BiPAP device for management of obstructive sleep apnea may continue to use it however use it when isolated from other individuals to avoid spread of COVID-19.   If you use a nebulizer administer medication such as albuterol you may continue to use it however only one isolated from other individuals to avoid the spread of COVID-19.  If your symptoms do not improve please follow-up with your primary care provider or urgent  care.  Return to the ER for significant shortness of breath, uncontrollable vomiting, severe chest pain, inability to tolerate fluids, changes in mental status such as confusion or other concerning symptoms.

## 2020-09-30 NOTE — ED Provider Notes (Signed)
[facility]-EMERGENCY DEPT Provider Note   CSN: [id number] Arrival date & time: 09/30/20  1950     History Chief Complaint  Patient presents with  . Covid Exposure    Kyle Sullivan is a 35 y.o. male with history of major depressive disorder, seizures (last episode when 35 years old).  Patient presents with chief complaint of chills, myalgias, cough, fatigue, and headache.  Patient reports that his symptoms began Thursday.  Patient reports he has been exposed to coworkers who have tested positive for COVID-19 and his girlfriend who has tested positive for influenza.  Patient denies any vaccinations for COVID-19 or influenza.   Patient also reports that today while driving to work he had an episode of hyperventilating caused lightheadedness, dizziness and numbness and tingling in his arms.  Patient reports that when EMS arrived at his house they found his blood pressure to be elevated at 200/140 and his temperature was 106F.  He denies any syncopal episodes or seizure-like activity.     HPI     Past Medical History:  Diagnosis Date  . Seizure [facility])    reports last was when he was 48-78 years old    Patient Active Problem List   Diagnosis Date Noted  . Major depressive disorder, single episode, severe without psychosis (HCC) 11/09/2016    History reviewed. No pertinent surgical history.     No family history on file.  Social History   Tobacco Use  . Smoking status: Current Every Day Smoker    Packs/day: 0.10    Types: Cigarettes  . Smokeless tobacco: Never Used  . Tobacco comment: pt declines patch/gum  Substance Use Topics  . Alcohol use: Yes    Comment: Says he drinks 2-3x/week; 1-2 drinks per sitting  . Drug use: No    Comment: pt says he "used to" smoke marijuana; UDS positive for it    Home Medications Prior to Admission medications   Medication Sig Start Date End Date Taking? Authorizing Provider  benzonatate (TESSALON) 100 MG  capsule Take 1 capsule (100 mg total) by mouth every 8 (eight) hours as needed for cough. 09/30/20  Yes Haskel Schroeder, PA-C  FLUoxetine (PROZAC) 10 MG capsule Take 1 capsule (10 mg total) by mouth daily. For depression 11/12/16   Armandina Stammer I, NP  ibuprofen (ADVIL,MOTRIN) 200 MG tablet Take 2 tablets (400 mg total) by mouth every 6 (six) hours as needed for headache, mild pain or moderate pain. 11/11/16   Armandina Stammer I, NP  nicotine (NICODERM CQ - DOSED IN MG/24 HOURS) 21 mg/24hr patch Place 1 patch (21 mg total) onto the skin daily. For smoking cessation 11/12/16   Armandina Stammer I, NP  traZODone (DESYREL) 50 MG tablet Take 1 tablet (50 mg total) by mouth at bedtime. For sleep 11/11/16   Armandina Stammer I, NP    Allergies    Patient has no known allergies.  Review of Systems   Review of Systems  Constitutional: Positive for chills, fatigue and fever.  HENT: Positive for congestion and rhinorrhea. Negative for sore throat.   Eyes: Negative for visual disturbance.  Respiratory: Positive for cough. Negative for shortness of breath.   Cardiovascular: Negative for chest pain.  Gastrointestinal: Negative for abdominal distention, abdominal pain, blood in stool, constipation, diarrhea, nausea and vomiting.  Genitourinary: Negative for difficulty urinating and dysuria.  Musculoskeletal: Positive for myalgias. Negative for back pain and neck pain.  Skin: Negative for color change and rash.  Neurological: Positive  for light-headedness and headaches. Negative for dizziness, seizures, syncope and facial asymmetry.  Psychiatric/Behavioral: Negative for confusion.    Physical Exam Updated Vital Signs BP (!) 152/89   Pulse 62   Temp 98.3 F (36.8 C) (Oral)   Resp 17   Ht 5\' 7"  (1.702 m)   Wt 90.7 kg   SpO2 100%   BMI 31.32 kg/m   Physical Exam Vitals and nursing note reviewed.  Constitutional:      General: He is not in acute distress.    Appearance: He is not ill-appearing,  toxic-appearing or diaphoretic.  HENT:     Head: Normocephalic and atraumatic.     Mouth/Throat:     Mouth: Mucous membranes are moist.     Pharynx: Oropharynx is clear. No oropharyngeal exudate or posterior oropharyngeal erythema.  Eyes:     General: No scleral icterus.       Right eye: No discharge.        Left eye: No discharge.     Extraocular Movements: Extraocular movements intact.     Pupils: Pupils are equal, round, and reactive to light.  Cardiovascular:     Rate and Rhythm: Normal rate.     Heart sounds: Normal heart sounds.  Pulmonary:     Effort: Pulmonary effort is normal. No respiratory distress.     Breath sounds: Normal breath sounds. No stridor. No wheezing, rhonchi or rales.  Chest:     Chest wall: No tenderness.  Abdominal:     General: There is no distension.     Palpations: Abdomen is soft.     Tenderness: There is no abdominal tenderness.  Musculoskeletal:     Cervical back: Normal range of motion and neck supple. No rigidity.  Skin:    General: Skin is warm and dry.  Neurological:     General: No focal deficit present.     Mental Status: He is alert and oriented to person, place, and time.     GCS: GCS eye subscore is 4. GCS verbal subscore is 5. GCS motor subscore is 6.     Cranial Nerves: No cranial nerve deficit or facial asymmetry.     Motor: No weakness, tremor, seizure activity or pronator drift.     Coordination: Romberg sign negative. Finger-Nose-Finger Test normal.     Gait: Gait is intact. Gait normal.     Comments: CN II-XII intact, equal grip strength, +5 strength to bilateral upper and lower extremities     Psychiatric:        Behavior: Behavior is cooperative.     ED Results / Procedures / Treatments   Labs (all labs ordered are listed, but only abnormal results are displayed) Labs Reviewed  SARS CORONAVIRUS 2 (TAT 6-24 HRS)    EKG None  Radiology No results found.  Procedures Procedures (including critical care  time)  Medications Ordered in ED Medications - No data to display  ED Course  I have reviewed the triage vital signs and the nursing notes.  Pertinent labs & imaging results that were available during my care of the patient were reviewed by me and considered in my medical decision making (see chart for details).    MDM Rules/Calculators/A&P                          Alert 35 year old male in no acute distress, nontoxic appearing.  Presents with a chief complaint of flulike symptoms.  Has not been vaccinated for COVID-19  or influenza.  Patient is able to speak in full complete sentences without difficulty, oxygen saturation 100% on room air, lungs clear to auscultation bilaterally.  Patient exposed to COVID-19 and influenza.  Patient tested for COVID-19 and results pending.  Patient given information for self-isolation.  Patient given scription for Tessalon.    Patient's episode of hyperventilation, numbness and tingling in arms and lightheadedness which likely related to anxiety.  Patient reports that he felt anxious during this episode which was made worse by comments from his mother.  Patient's blood pressure was elevated at 150/80s however no headache, visual disturbance, or focal neurological deficit at this time. Will have patient follow-up with primary care provider.    Patient given strict return precautions.  Patient expressed understanding of all instructions and agreeable to plan.  Kyle Sullivan was evaluated in Emergency Department on 09/30/2020 for the symptoms described in the history of present illness. He was evaluated in the context of the global COVID-19 pandemic, which necessitated consideration that the patient might be at risk for infection with the SARS-CoV-2 virus that causes COVID-19. Institutional protocols and algorithms that pertain to the evaluation of patients at risk for COVID-19 are in a state of rapid change based on information released by regulatory bodies  including the CDC and federal and state organizations. These policies and algorithms were followed during the patient's care in the ED.   Final Clinical Impression(s) / ED Diagnoses Final diagnoses:  Flu-like symptoms    Rx / DC Orders ED Discharge Orders         Ordered    benzonatate (TESSALON) 100 MG capsule  Every 8 hours PRN        09/30/20 2258           Berneice Heinrich 09/30/20 2313    Tilden Fossa, MD 10/02/20 0000

## 2020-10-01 LAB — SARS CORONAVIRUS 2 (TAT 6-24 HRS): SARS Coronavirus 2: POSITIVE — AB

## 2021-01-14 ENCOUNTER — Emergency Department (HOSPITAL_COMMUNITY)
Admission: EM | Admit: 2021-01-14 | Discharge: 2021-01-14 | Disposition: A | Discharge status: Home / Self Care | Payer: Self-pay | Attending: Emergency Medicine | Admitting: Emergency Medicine

## 2021-01-14 ENCOUNTER — Emergency Department (HOSPITAL_COMMUNITY): Payer: Self-pay

## 2021-01-14 ENCOUNTER — Encounter (HOSPITAL_COMMUNITY): Payer: Self-pay

## 2021-01-14 DIAGNOSIS — Y9289 Other specified places as the place of occurrence of the external cause: Secondary | ICD-10-CM | POA: Insufficient documentation

## 2021-01-14 DIAGNOSIS — Y990 Civilian activity done for income or pay: Secondary | ICD-10-CM | POA: Insufficient documentation

## 2021-01-14 DIAGNOSIS — X509XXA Other and unspecified overexertion or strenuous movements or postures, initial encounter: Secondary | ICD-10-CM | POA: Insufficient documentation

## 2021-01-14 DIAGNOSIS — F17210 Nicotine dependence, cigarettes, uncomplicated: Secondary | ICD-10-CM | POA: Insufficient documentation

## 2021-01-14 DIAGNOSIS — M25512 Pain in left shoulder: Secondary | ICD-10-CM | POA: Insufficient documentation

## 2021-01-14 MED ORDER — METHOCARBAMOL 500 MG PO TABS: 500.0000 mg | ORAL_TABLET | Freq: Two times a day (BID) | ORAL | 0 refills | Status: AC

## 2021-01-14 NOTE — ED Triage Notes (Signed)
Emergency Medicine Provider Triage Evaluation Note  Kyle Sullivan , a 35 y.o. male  was evaluated in triage.  Pt complains of  L shoulder pain two weeks ago when lifting.   Review of Systems  Positive: Shoulder pain Negative: fever  Physical Exam  BP (!) 160/101 (BP Location: Right Arm)   Pulse 82   Temp 98.3 F (36.8 C)   Resp 18   Ht 5\' 8"  (1.727 m)   Wt 84.8 kg   SpO2 99%   BMI 28.43 kg/m  Gen:   Awake, no distress   HEENT:  Atraumatic Resp:  Normal effort  Cardiac:  Normal rate  Abd:   Nondistended, nontender  MSK:   L shoulder tender, limited ROM due to pain Neuro:  Speech clear   Medical Decision Making  Medically screening exam initiated at 12:28 PM.  Appropriate orders placed.  Sung D Knauer was informed that the remainder of the evaluation will be completed by another provider, this initial triage assessment does not replace that evaluation, and the importance of remaining in the ED until their evaluation is complete.  Clinical Impression  Stable  MSE was initiated and I personally evaluated the patient and placed orders (if any) at  12:29 PM on January 14, 2021.  The patient appears stable so that the remainder of the MSE may be completed by another provider.    January 16, 2021, PA-C 01/14/21 1230

## 2021-01-14 NOTE — ED Triage Notes (Signed)
Pt arrived via walk in, c/o left shoulder pain since 4/12. States heard pop while lifting box at work. Pain with mvmt since, worsening.

## 2021-01-14 NOTE — ED Provider Notes (Signed)
[facility]-EMERGENCY DEPT Provider Note   CSN: [id number] Arrival date & time: 01/14/21  1129     History Chief Complaint  Patient presents with  . Shoulder Pain    Kyle Sullivan is a 35 y.o. male with no pertinent PMH that presents to the ED for shoulder pain for the last two weeks when lifting boxes at work. 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 States that it hurts when he lifts his arm overhead and when he sleeps at night worse. No prior injury or falls to this area. Denies any neck pain or back pain. No numbness or tingling down into his arm. No weakness in the arm. Has not tried anything for this. No other complaints.   HPI     Past Medical History:  Diagnosis Date  . Seizure [facility])    reports last was when he was [age] years old    Patient Active Problem List   Diagnosis Date Noted  . Major depressive disorder, single episode, severe without psychosis (HCC) 11/09/2016    History reviewed. No pertinent surgical history.     History reviewed. No pertinent family history.  Social History   Tobacco Use  . Smoking status: Current Every Day Smoker    Packs/day: 0.10    Types: Cigarettes  . Smokeless tobacco: Never Used  . Tobacco comment: pt declines patch/gum  Substance Use Topics  . Alcohol use: Yes    Comment: Says he drinks 2-3x/week; 1-2 drinks per sitting  . Drug use: No    Comment: pt says he "used to" smoke marijuana; UDS positive for it    Home Medications Prior to Admission medications   Medication Sig Start Date End Date Taking? Authorizing Provider  methocarbamol (ROBAXIN) 500 MG tablet Take 1 tablet (500 mg total) by mouth 2 (two) times daily. 01/14/21  Yes Carrell Rahmani, PA-C  benzonatate (TESSALON) 100 MG capsule Take 1 capsule (100 mg total) by mouth every 8 (eight) hours as needed for cough. 09/30/20   Haskel Schroeder, PA-C  FLUoxetine (PROZAC) 10 MG capsule Take 1 capsule (10 mg total) by  mouth daily. For depression 11/12/16   Armandina Stammer I, NP  ibuprofen (ADVIL,MOTRIN) 200 MG tablet Take 2 tablets (400 mg total) by mouth every 6 (six) hours as needed for headache, mild pain or moderate pain. 11/11/16   Armandina Stammer I, NP  nicotine (NICODERM CQ - DOSED IN MG/24 HOURS) 21 mg/24hr patch Place 1 patch (21 mg total) onto the skin daily. For smoking cessation 11/12/16   Armandina Stammer I, NP  traZODone (DESYREL) 50 MG tablet Take 1 tablet (50 mg total) by mouth at bedtime. For sleep 11/11/16   Armandina Stammer I, NP    Allergies    Patient has no known allergies.  Review of Systems   Review of Systems  Constitutional: Negative for diaphoresis, fatigue and fever.  Eyes: Negative for visual disturbance.  Respiratory: Negative for shortness of breath.   Cardiovascular: Negative for chest pain.  Gastrointestinal: Negative for nausea and vomiting.  Musculoskeletal: Positive for arthralgias. Negative for back pain and myalgias.  Skin: Negative for color change, pallor, rash and wound.  Neurological: Negative for syncope, weakness, light-headedness, numbness and headaches.  Psychiatric/Behavioral: Negative for behavioral problems and confusion.    Physical Exam Updated Vital Signs BP (!) 160/101 (BP Location: Right Arm)   Pulse 82   Temp 98.3 F (36.8 C)   Resp 18   Ht 5\' 8"  (1.727 m)  Wt 84.8 kg   SpO2 99%   BMI 28.43 kg/m   Physical Exam Constitutional:      General: He is not in acute distress.    Appearance: Normal appearance. He is not ill-appearing, toxic-appearing or diaphoretic.  Cardiovascular:     Rate and Rhythm: Normal rate and regular rhythm.     Pulses: Normal pulses.  Pulmonary:     Effort: Pulmonary effort is normal.     Breath sounds: Normal breath sounds.  Musculoskeletal:        General: Normal range of motion.     Comments: L shoulder with tenderness to lateral and anterior deltoid. No erythema or warmth. No biceps deformity. Pain worse with external  rotation and lift off test. Able to passively extend arm on L side about 90 degrees, actively with normal range of motion. No weakness to shoulder. Normal strength to shoulder, elbow and wrist.  Normal sensation. Radial pulse 2+. Cap refill <2 seconds.   Skin:    General: Skin is warm and dry.     Capillary Refill: Capillary refill takes less than 2 seconds.  Neurological:     General: No focal deficit present.     Mental Status: He is alert and oriented to person, place, and time.  Psychiatric:        Mood and Affect: Mood normal.        Behavior: Behavior normal.        Thought Content: Thought content normal.     ED Results / Procedures / Treatments   Labs (all labs ordered are listed, but only abnormal results are displayed) Labs Reviewed - No data to display  EKG None  Radiology DG Shoulder Left  Result Date: 01/14/2021 CLINICAL DATA:  Onset left shoulder pain when the patient heard a pop in the shoulder when he lifted a box on 12/30/2020. Initial encounter. EXAM: LEFT SHOULDER - 2+ VIEW COMPARISON:  None. FINDINGS: There is no evidence of fracture or dislocation. There is no evidence of arthropathy or other focal bone abnormality. Soft tissues are unremarkable. IMPRESSION: Normal exam. Electronically Signed   By: Drusilla Kanner M.D.   On: 01/14/2021 13:36    Procedures Procedures   Medications Ordered in ED Medications - No data to display  ED Course  I have reviewed the triage vital signs and the nursing notes.  Pertinent labs & imaging results that were available during my care of the patient were reviewed by me and considered in my medical decision making (see chart for details).    MDM Rules/Calculators/A&P                          Pt presents with shoulder pain, most likely rotator cuff syndrome or strain to shoulder. Distally neurovascularly intact, plain films negative for fracture or dislocation. Symptomatic treatment discussed, pt to follow up with ortho.    Doubt need for further emergent work up at this time. I explained the diagnosis and have given explicit precautions to return to the ER including for any other new or worsening symptoms. The patient understands and accepts the medical plan as it's been dictated and I have answered their questions. Discharge instructions concerning home care and prescriptions have been given. The patient is STABLE and is discharged to home in good condition.   Final Clinical Impression(s) / ED Diagnoses Final diagnoses:  Acute pain of left shoulder    Rx / DC Orders ED Discharge Orders  Ordered    methocarbamol (ROBAXIN) 500 MG tablet  2 times daily        01/14/21 1433           Farrel Gordon, PA-C 01/14/21 1436    Derwood Kaplan, MD 01/15/21 2230

## 2021-01-14 NOTE — Discharge Instructions (Addendum)
  You were evaluated in the Emergency Department and after careful evaluation, we did not find any emergent condition requiring admission or further testing in the hospital.   Your exam/testing today was overall reassuring.  Symptoms seem to be due to  shoulder strain or rotator cuff syndrome. Use the attacthed instructions, use Naproxen or Tylenol as prescribed on the bottle and the muscle relaxant at night. If symptoms dont improve please follow up with orthopedics doctor.  Please return to the Emergency Department if you experience any worsening of your condition.  Thank you for allowing [place] to be a part of your care. Please speak to your pharmacist about any new medications prescribed today in regards to side effects or interactions with other medications.   Your BP was also elevated today. Please follow up with your PCP about this.

## 2022-12-28 ENCOUNTER — Emergency Department: Payer: 59

## 2022-12-28 ENCOUNTER — Emergency Department
Admission: EM | Admit: 2022-12-28 | Discharge: 2022-12-28 | Disposition: A | Discharge status: Home / Self Care | Payer: 59 | Attending: Emergency Medicine | Admitting: Emergency Medicine

## 2022-12-28 ENCOUNTER — Other Ambulatory Visit: Payer: Self-pay

## 2022-12-28 DIAGNOSIS — R1011 Right upper quadrant pain: Secondary | ICD-10-CM

## 2022-12-28 DIAGNOSIS — K5900 Constipation, unspecified: Secondary | ICD-10-CM | POA: Insufficient documentation

## 2022-12-28 LAB — URINALYSIS, ROUTINE W REFLEX MICROSCOPIC
Bilirubin Urine: NEGATIVE
Glucose, UA: NEGATIVE mg/dL
Hgb urine dipstick: NEGATIVE
Ketones, ur: NEGATIVE mg/dL
Leukocytes,Ua: NEGATIVE
Nitrite: NEGATIVE
Protein, ur: 100 mg/dL — AB
Specific Gravity, Urine: 1.03 (ref 1.005–1.030)
Squamous Epithelial / HPF: NONE SEEN /HPF (ref 0–5)
pH: 5 (ref 5.0–8.0)

## 2022-12-28 LAB — COMPREHENSIVE METABOLIC PANEL
ALT: 17 U/L (ref 0–44)
AST: 21 U/L (ref 15–41)
Albumin: 4.8 g/dL (ref 3.5–5.0)
Alkaline Phosphatase: 62 U/L (ref 38–126)
Anion gap: 6 (ref 5–15)
BUN: 11 mg/dL (ref 6–20)
CO2: 28 mmol/L (ref 22–32)
Calcium: 9.3 mg/dL (ref 8.9–10.3)
Chloride: 105 mmol/L (ref 98–111)
Creatinine, Ser: 1.33 mg/dL — ABNORMAL HIGH (ref 0.61–1.24)
GFR, Estimated: >60 mL/min (ref >60)
Glucose, Bld: 105 mg/dL — ABNORMAL HIGH (ref 70–99)
Potassium: 3.6 mmol/L (ref 3.5–5.1)
Sodium: 139 mmol/L (ref 135–145)
Total Bilirubin: 0.8 mg/dL (ref 0.3–1.2)
Total Protein: 8.7 g/dL — ABNORMAL HIGH (ref 6.5–8.1)

## 2022-12-28 LAB — CBC
HCT: 40 % (ref 39.0–52.0)
Hemoglobin: 13.6 g/dL (ref 13.0–17.0)
MCH: 31.3 pg (ref 26.0–34.0)
MCHC: 34 g/dL (ref 30.0–36.0)
MCV: 92 fL (ref 80.0–100.0)
Platelets: 433 10*3/uL — ABNORMAL HIGH (ref 150–400)
RBC: 4.35 MIL/uL (ref 4.22–5.81)
RDW: 11.3 % — ABNORMAL LOW (ref 11.5–15.5)
WBC: 6.8 10*3/uL (ref 4.0–10.5)
nRBC: 0 % (ref 0.0–0.2)

## 2022-12-28 LAB — LIPASE, BLOOD: Lipase: 28 U/L (ref 11–51)

## 2022-12-28 MED ORDER — POLYETHYLENE GLYCOL 3350 17 G PO PACK: 17.0000 g | PACK | Freq: Every day | ORAL | 0 refills | Status: AC

## 2022-12-28 NOTE — ED Provider Notes (Signed)
[facility] Provider Note    Event Date/Time   First MD Initiated Contact with Patient 12/28/22 1050     (approximate)   History   Abdominal Pain   HPI  Kyle Sullivan is a 37 y.o. male with no history of abdominal surgery who presents with complaints of right upper quadrant pain intermittently over the last several weeks.  He describes the pain as tight and sometimes with some radiation into the right lower quadrant and back.  Currently feels well has no complaints.  No vomiting.     Physical Exam   Triage Vital Signs: ED Triage Vitals  Enc Vitals Group     BP 12/28/22 1027 (!) 157/99     Pulse Rate 12/28/22 1027 97     Resp 12/28/22 1027 16     Temp 12/28/22 1027 98.3 F (36.8 C)     Temp Source 12/28/22 1027 Oral     SpO2 12/28/22 1027 97 %     Weight 12/28/22 1028 85.7 kg (189 lb)     Height 12/28/22 1028 1.778 m (5\' 10" )     Head Circumference --      Peak Flow --      Pain Score 12/28/22 1027 5     Pain Loc --      Pain Edu? --      Excl. in GC? --     Most recent vital signs: Vitals:   12/28/22 1027  BP: (!) 157/99  Pulse: 97  Resp: 16  Temp: 98.3 F (36.8 C)  SpO2: 97%     General: Awake, no distress.  CV:  Good peripheral perfusion.  Resp:  Normal effort.  Abd:  No distention.  Other:  Mild tenderness right upper quadrant, otherwise reassuring exam   ED Results / Procedures / Treatments   Labs (all labs ordered are listed, but only abnormal results are displayed) Labs Reviewed  COMPREHENSIVE METABOLIC PANEL - Abnormal; Notable for the following components:      Result Value   Glucose, Bld 105 (*)    Creatinine, Ser 1.33 (*)    Total Protein 8.7 (*)    All other components within normal limits  CBC - Abnormal; Notable for the following components:   RDW 11.3 (*)    Platelets 433 (*)    All other components within normal limits  URINALYSIS, ROUTINE W REFLEX MICROSCOPIC - Abnormal; Notable for the  following components:   Color, Urine AMBER (*)    APPearance HAZY (*)    Protein, ur 100 (*)    Bacteria, UA FEW (*)    All other components within normal limits  LIPASE, BLOOD     EKG     RADIOLOGY Ultrasound abdomen, viewed interpreted by me, normal gallbladder    PROCEDURES:  Critical Care performed:   Procedures   MEDICATIONS ORDERED IN ED: Medications - No data to display   IMPRESSION / MDM / ASSESSMENT AND PLAN / ED COURSE  I reviewed the triage vital signs and the nursing notes. Patient's presentation is most consistent with acute illness / injury with system symptoms.  Patient presents with abdominal pain as detailed above, differential includes cholelithiasis, gastritis, less likely pancreatitis  Lab work reviewed and is reassuring, LFTs are normal, lipase is normal will send for ultrasound of the right upper quadrant to evaluate for cholelithiasis.  Normal ultrasound, lab work is unremarkable, patient does complain of constipation as well, this could be a cause of his discomfort,  will trial MiraLAX, outpatient follow-up, return precautions discussed, patient agrees to this plan      FINAL CLINICAL IMPRESSION(S) / ED DIAGNOSES   Final diagnoses:  Right upper quadrant abdominal pain  Constipation, unspecified constipation type     Rx / DC Orders   ED Discharge Orders          Ordered    polyethylene glycol (MIRALAX) 17 g packet  Daily        12/28/22 1202             Note:  This document was prepared using Dragon voice recognition software and may include unintentional dictation errors.   Jene Every, MD 12/28/22 1240

## 2022-12-28 NOTE — ED Triage Notes (Addendum)
Pt reports intermittent issues with his abd for 3 weeks, states that he is having ruq discomfort, has been constipated and is passing a lot of mucous in his stools, reports that there have been times he tried to have a bm and only mucous was expelled. Pt reports that his brother passed at age 36 from lung cancer is unaware if genetic testing was done

## 2023-08-05 DIAGNOSIS — R1011 Right upper quadrant pain: Secondary | ICD-10-CM | POA: Diagnosis not present

## 2023-08-05 DIAGNOSIS — R0789 Other chest pain: Secondary | ICD-10-CM | POA: Diagnosis not present

## 2023-08-05 DIAGNOSIS — M545 Low back pain, unspecified: Secondary | ICD-10-CM | POA: Diagnosis not present

## 2023-08-08 DIAGNOSIS — R1011 Right upper quadrant pain: Secondary | ICD-10-CM | POA: Diagnosis not present
